# Patient Record
Sex: Female | Born: 1940
Health system: Southern US, Community
[De-identification: ages and names within clinical notes are randomized; demographics above are authoritative.]

## PROBLEM LIST (undated history)

## (undated) DIAGNOSIS — F419 Anxiety disorder, unspecified: Secondary | ICD-10-CM

## (undated) DIAGNOSIS — E7800 Pure hypercholesterolemia, unspecified: Secondary | ICD-10-CM

## (undated) DIAGNOSIS — E119 Type 2 diabetes mellitus without complications: Secondary | ICD-10-CM

## (undated) DIAGNOSIS — I10 Essential (primary) hypertension: Secondary | ICD-10-CM

## (undated) DIAGNOSIS — D696 Thrombocytopenia, unspecified: Secondary | ICD-10-CM

## (undated) HISTORY — DX: Type 2 diabetes mellitus without complications: E11.9

## (undated) HISTORY — DX: Anxiety disorder, unspecified: F41.9

## (undated) HISTORY — PX: COLONOSCOPY: SHX174

## (undated) HISTORY — DX: Pure hypercholesterolemia, unspecified: E78.00

## (undated) HISTORY — DX: Essential (primary) hypertension: I10

## (undated) HISTORY — DX: Thrombocytopenia, unspecified: D69.6

## (undated) HISTORY — PX: VAGINAL HYSTERECTOMY: SUR661

## (undated) HISTORY — PX: EYE EXAMINATION UNDER ANESTHESIA W/ RETINAL CRYOTHERAPY AND RETINAL LASER: SHX1561

---

## 1990-11-02 HISTORY — PX: BREAST EXCISIONAL BIOPSY: SUR124

## 1998-03-18 ENCOUNTER — Ambulatory Visit (HOSPITAL_COMMUNITY): Admission: RE | Admit: 1998-03-18 | Discharge: 1998-03-18 | Payer: Self-pay | Admitting: Cardiology

## 1999-03-06 ENCOUNTER — Other Ambulatory Visit: Admission: RE | Admit: 1999-03-06 | Discharge: 1999-03-06 | Payer: Self-pay | Admitting: *Deleted

## 1999-05-22 ENCOUNTER — Ambulatory Visit (HOSPITAL_COMMUNITY): Admission: RE | Admit: 1999-05-22 | Discharge: 1999-05-22 | Payer: Self-pay | Admitting: Cardiology

## 1999-06-03 ENCOUNTER — Ambulatory Visit (HOSPITAL_COMMUNITY): Admission: RE | Admit: 1999-06-03 | Discharge: 1999-06-03 | Payer: Self-pay | Admitting: Obstetrics and Gynecology

## 1999-06-03 ENCOUNTER — Encounter: Payer: Self-pay | Admitting: Cardiology

## 1999-12-23 ENCOUNTER — Encounter: Admission: RE | Admit: 1999-12-23 | Discharge: 1999-12-23 | Payer: Self-pay | Admitting: Cardiology

## 1999-12-23 ENCOUNTER — Encounter: Payer: Self-pay | Admitting: Cardiology

## 1999-12-24 ENCOUNTER — Ambulatory Visit (HOSPITAL_COMMUNITY): Admission: RE | Admit: 1999-12-24 | Discharge: 1999-12-24 | Payer: Self-pay | Admitting: Cardiology

## 2000-04-26 ENCOUNTER — Other Ambulatory Visit: Admission: RE | Admit: 2000-04-26 | Discharge: 2000-04-26 | Payer: Self-pay | Admitting: Obstetrics and Gynecology

## 2000-06-18 ENCOUNTER — Encounter: Payer: Self-pay | Admitting: Cardiology

## 2000-06-18 ENCOUNTER — Ambulatory Visit (HOSPITAL_COMMUNITY): Admission: RE | Admit: 2000-06-18 | Discharge: 2000-06-18 | Payer: Self-pay | Admitting: Cardiology

## 2000-06-28 ENCOUNTER — Ambulatory Visit (HOSPITAL_COMMUNITY): Admission: RE | Admit: 2000-06-28 | Discharge: 2000-06-28 | Payer: Self-pay | Admitting: Cardiology

## 2000-06-30 ENCOUNTER — Encounter: Admission: RE | Admit: 2000-06-30 | Discharge: 2000-09-28 | Payer: Self-pay | Admitting: Cardiology

## 2001-06-06 ENCOUNTER — Other Ambulatory Visit: Admission: RE | Admit: 2001-06-06 | Discharge: 2001-06-06 | Payer: Self-pay | Admitting: Obstetrics and Gynecology

## 2001-06-10 ENCOUNTER — Ambulatory Visit (HOSPITAL_COMMUNITY): Admission: RE | Admit: 2001-06-10 | Discharge: 2001-06-10 | Payer: Self-pay | Admitting: Obstetrics and Gynecology

## 2001-06-10 ENCOUNTER — Encounter: Payer: Self-pay | Admitting: Obstetrics and Gynecology

## 2002-07-13 ENCOUNTER — Encounter: Payer: Self-pay | Admitting: Obstetrics and Gynecology

## 2002-07-13 ENCOUNTER — Ambulatory Visit (HOSPITAL_COMMUNITY): Admission: RE | Admit: 2002-07-13 | Discharge: 2002-07-13 | Payer: Self-pay | Admitting: Obstetrics and Gynecology

## 2002-09-11 ENCOUNTER — Other Ambulatory Visit: Admission: RE | Admit: 2002-09-11 | Discharge: 2002-09-11 | Payer: Self-pay | Admitting: Obstetrics and Gynecology

## 2003-07-25 ENCOUNTER — Encounter: Payer: Self-pay | Admitting: Obstetrics and Gynecology

## 2003-07-25 ENCOUNTER — Ambulatory Visit (HOSPITAL_COMMUNITY): Admission: RE | Admit: 2003-07-25 | Discharge: 2003-07-25 | Payer: Self-pay | Admitting: Obstetrics and Gynecology

## 2003-09-19 ENCOUNTER — Other Ambulatory Visit: Admission: RE | Admit: 2003-09-19 | Discharge: 2003-09-19 | Payer: Self-pay | Admitting: Obstetrics and Gynecology

## 2004-03-26 ENCOUNTER — Encounter: Admission: RE | Admit: 2004-03-26 | Discharge: 2004-03-26 | Payer: Self-pay | Admitting: Cardiology

## 2004-03-26 IMAGING — CR DG HIP (WITH OR WITHOUT PELVIS) 2-3V*L*
3 series · 3 of 3 positions shown · non-contrast
Comparison: none

CLINICAL DATA: Left lateral hip pain.
 LEFT HIP COMPLETE ? TWO VIEWS
 There is diffuse fairly symmetrical joint space narrowing with subchondral cysts in the superior aspect of the left acetabulum.  Femoral head appears normal.  No soft tissue calcifications.  The patient has similar, but less extensive findings in the right acetabulum. 
 IMPRESSION
 Degenerative arthritic changes of the left acetabulum with subchondral cysts on the superior aspect of the acetabulum.  Mild joint space narrowing particularly superiorly.

[view not recorded (1 of 3)]
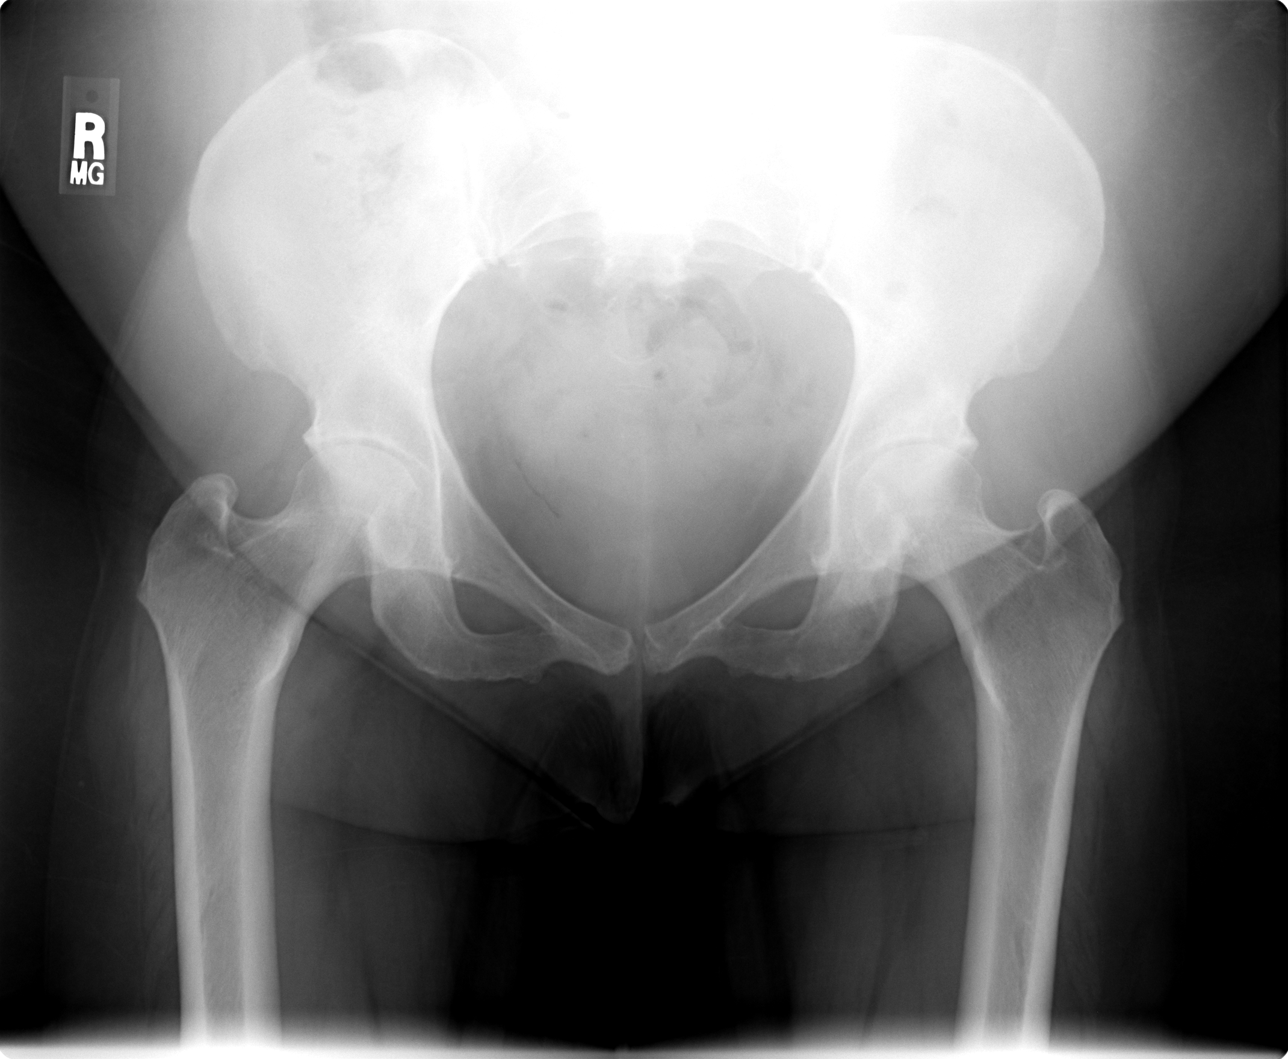

[view not recorded (2 of 3)]
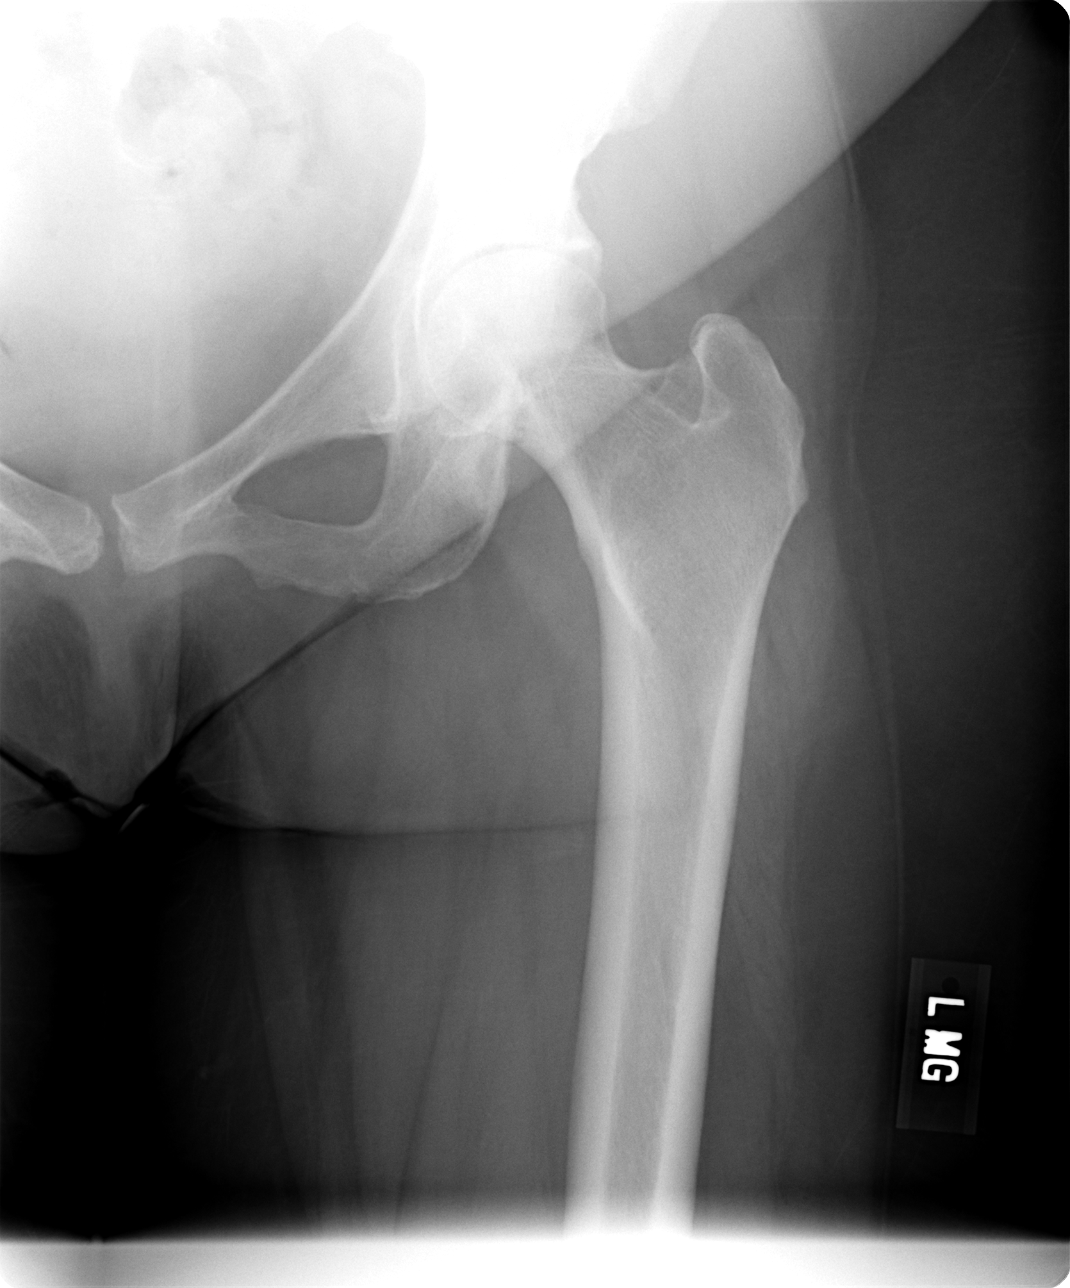

[view not recorded (3 of 3)]
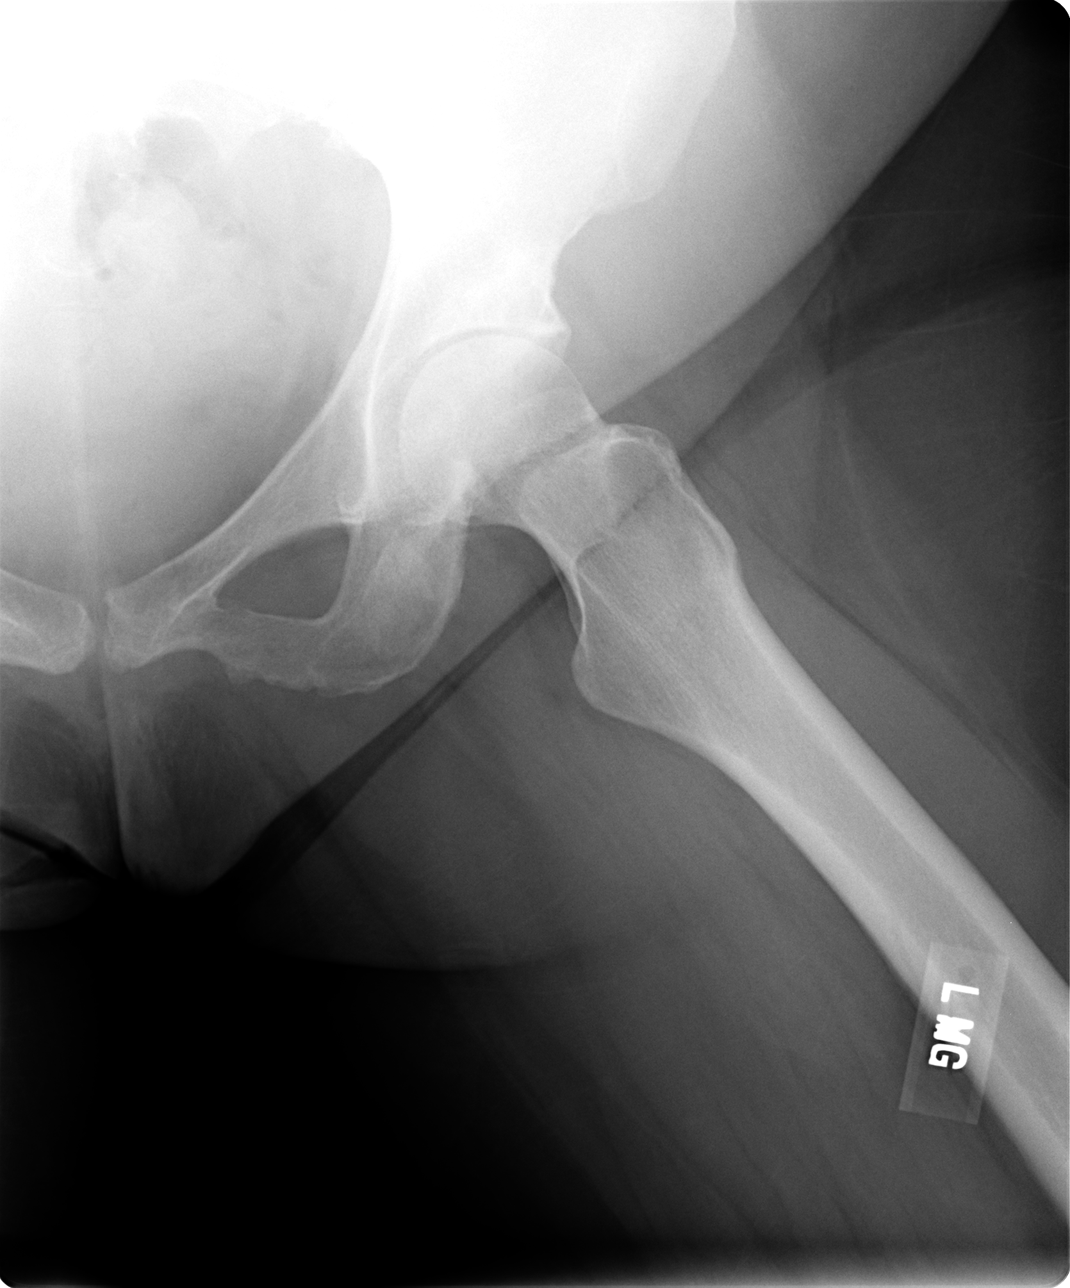

[3 of 3 positions shown; findings below may reference images not displayed]

## 2004-09-10 ENCOUNTER — Ambulatory Visit (HOSPITAL_COMMUNITY): Admission: RE | Admit: 2004-09-10 | Discharge: 2004-09-10 | Payer: Self-pay | Admitting: Cardiology

## 2005-09-28 ENCOUNTER — Ambulatory Visit (HOSPITAL_COMMUNITY): Admission: RE | Admit: 2005-09-28 | Discharge: 2005-09-28 | Payer: Self-pay | Admitting: Cardiology

## 2006-03-24 ENCOUNTER — Ambulatory Visit: Payer: Self-pay | Admitting: Hematology & Oncology

## 2006-03-31 LAB — CBC WITH DIFFERENTIAL/PLATELET
BASO%: 0.6 % (ref 0.0–2.0)
EOS%: 2.4 % (ref 0.0–7.0)
Eosinophils Absolute: 0.1 10*3/uL (ref 0.0–0.5)
LYMPH%: 19.9 % (ref 14.0–48.0)
MONO%: 7.4 % (ref 0.0–13.0)
NEUT%: 69.7 % (ref 39.6–76.8)
WBC: 5.6 10*3/uL (ref 3.9–10.0)

## 2006-03-31 LAB — CHCC SMEAR

## 2006-04-01 LAB — ANA: Anti Nuclear Antibody(ANA): NEGATIVE

## 2006-04-01 LAB — VITAMIN B12: Vitamin B-12: 744 pg/mL (ref 211–911)

## 2006-04-01 LAB — PROTEIN ELECTROPHORESIS, SERUM: Gamma Globulin: 11.9 % (ref 11.1–18.8)

## 2006-06-11 ENCOUNTER — Encounter: Admission: RE | Admit: 2006-06-11 | Discharge: 2006-06-11 | Payer: Self-pay | Admitting: General Surgery

## 2006-06-11 IMAGING — CT CT ABDOMEN W/ CM
3 of 7 series · 12 of 46 positions shown, 19 images · IV contrast (redicat/h2o & [ID] omnipaque)
Comparison: none

CLINICAL DATA: Left groin pain, evaluate for inguinal hernia.  
 CT ABDOMEN AND PELVIS WITH CONTRAST:
TECHNIQUE: Multidetector CT imaging of the abdomen and pelvis was performed following the standard protocol during bolus administration of intravenous contrast.
 Contrast:  533cc Omnipaque 300.
 CT ABDOMEN WITH CONTRAST:

[Series 2: routine abd/pelvis · axial · 0.70mm/px · z∈[-360,-50]mm · 8 of 80 slices shown, 13 images]
[im 9/80  soft-tissue]
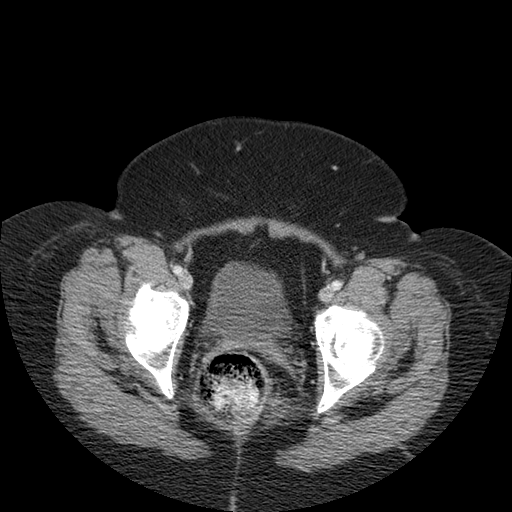
[im 9/80  bone]
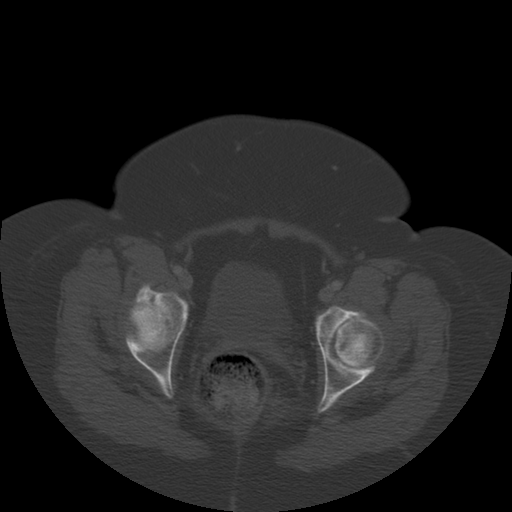
[im 18/80  soft-tissue]
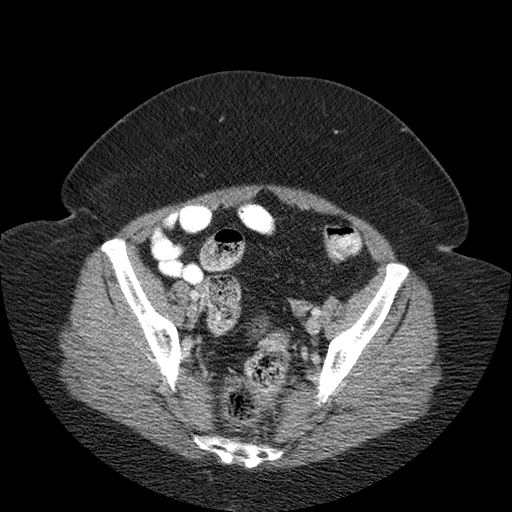
[im 27/80  soft-tissue]
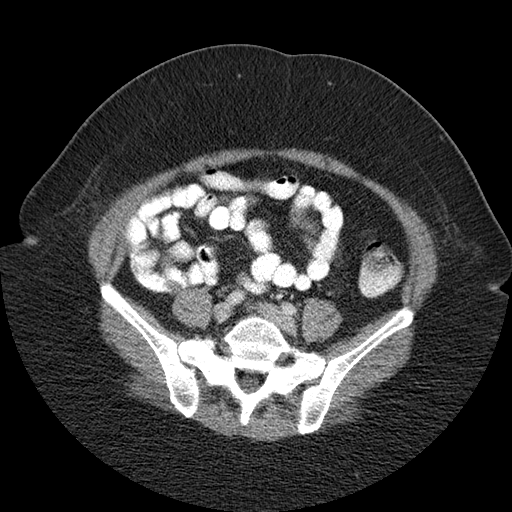
[im 36/80  soft-tissue]
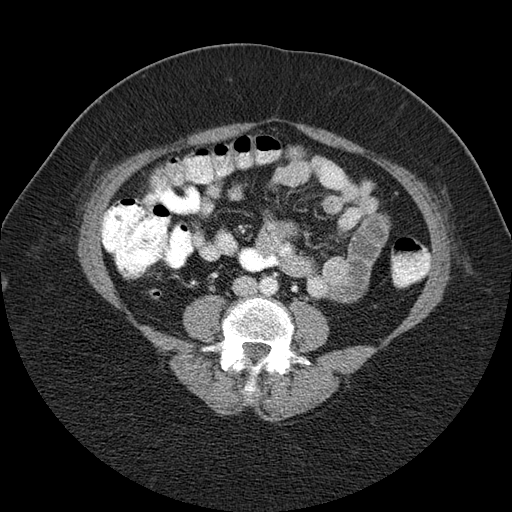
[im 44/80  soft-tissue]
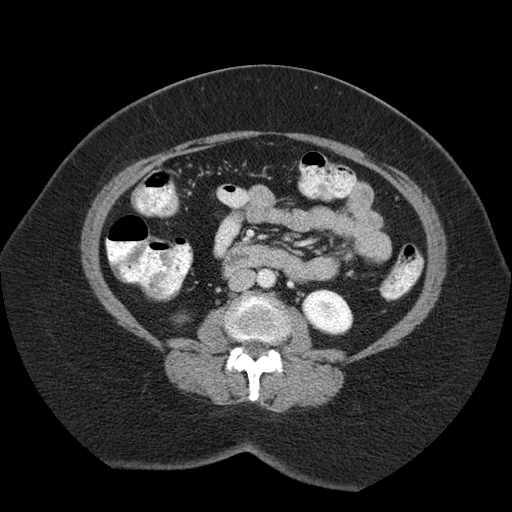
[im 44/80  lung]
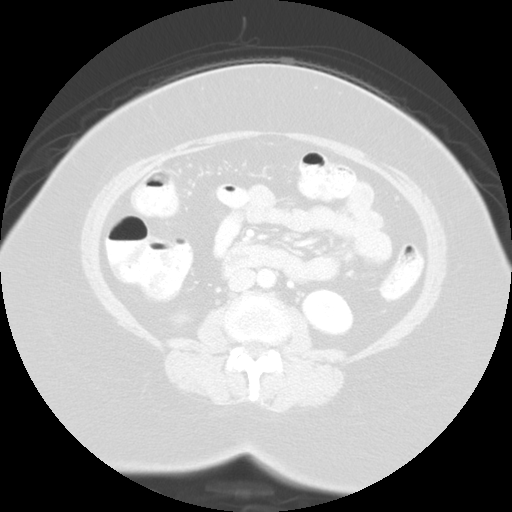
[im 53/80  soft-tissue]
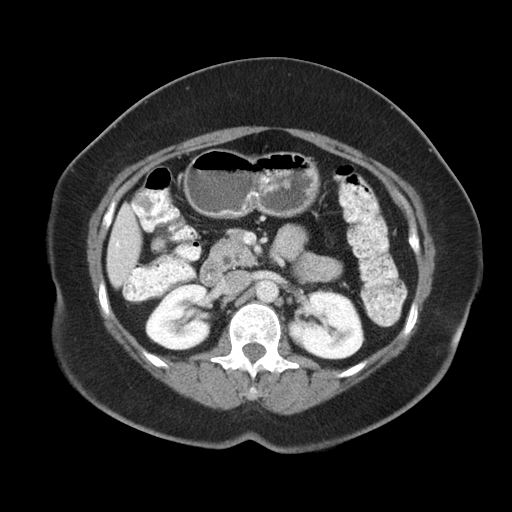
[im 53/80  lung]
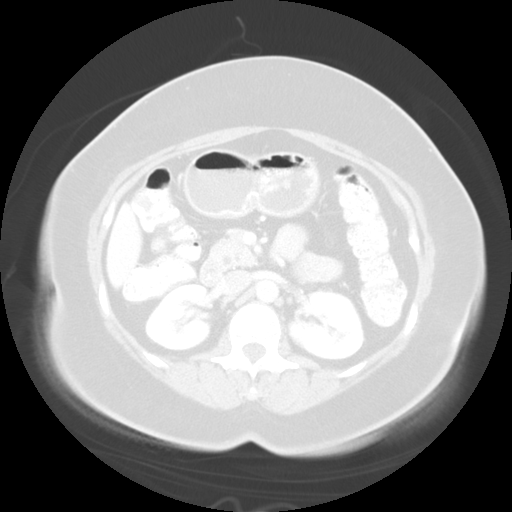
[im 62/80  soft-tissue]
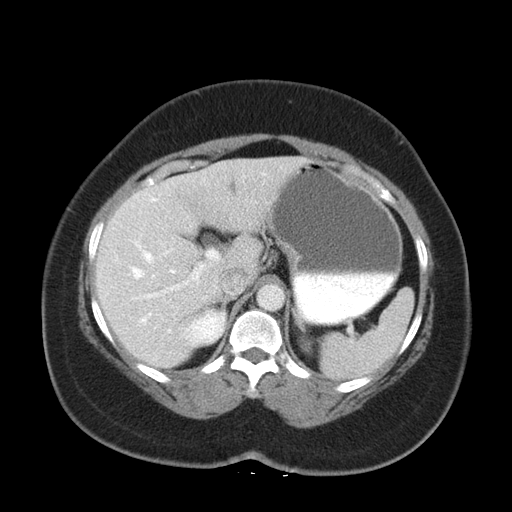
[im 62/80  lung]
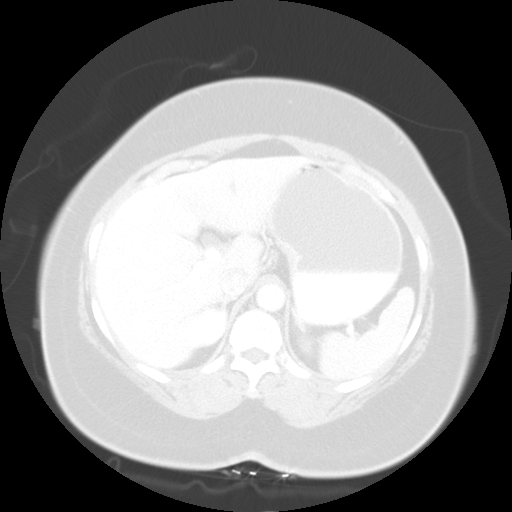
[im 71/80  soft-tissue]
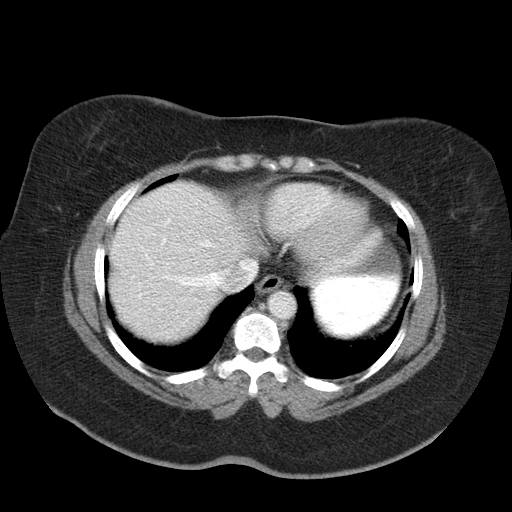
[im 71/80  lung]
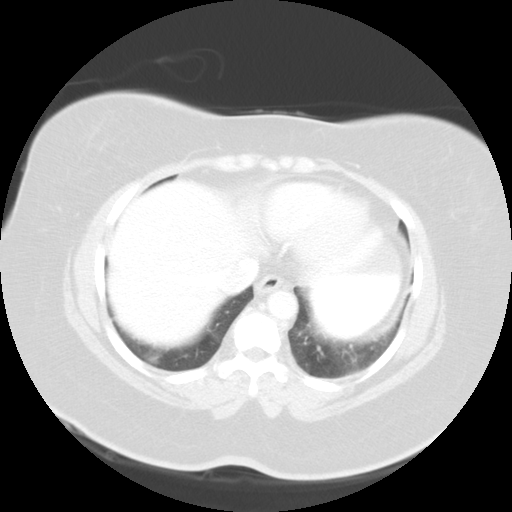

[Series 985: reformatted · sagittal · 0.81mm/px · 3 of 118 slices shown, 4 images (1 of 2)]
[im 40/118  soft-tissue]
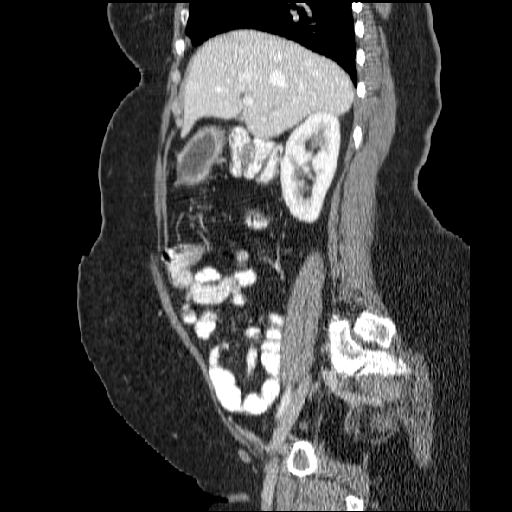
[im 53/118  soft-tissue]
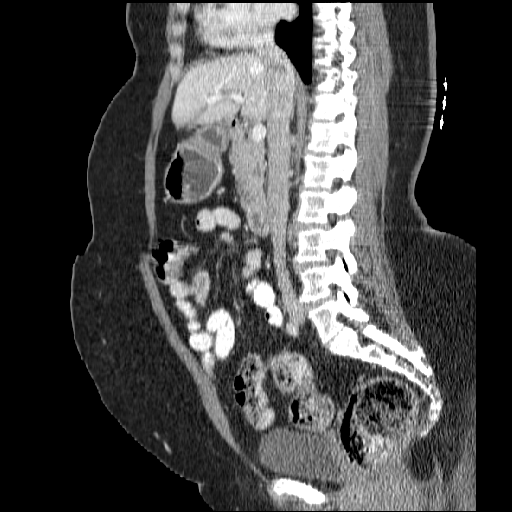
[im 53/118  bone]
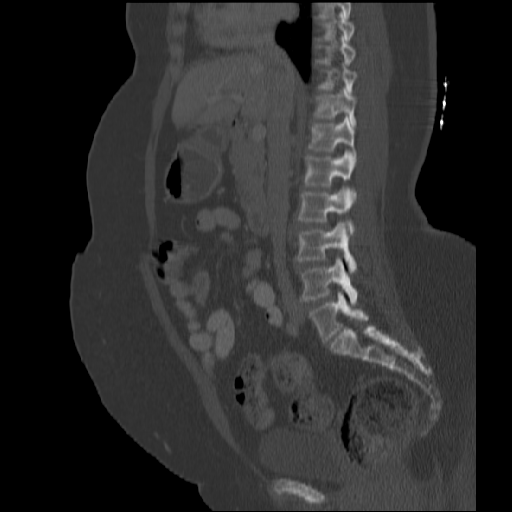
[im 66/118  soft-tissue]
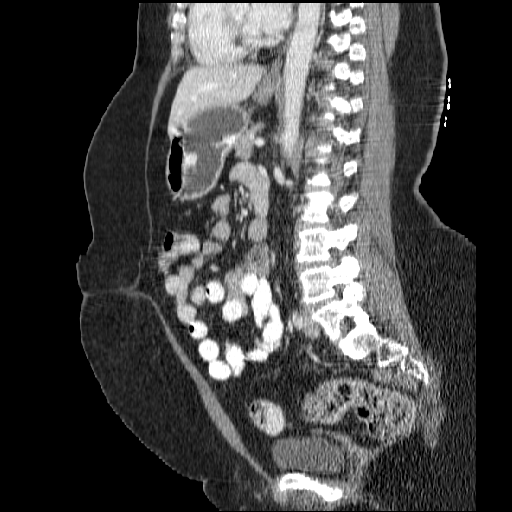

[Series 986: reformatted · coronal · 0.81mm/px · 1 of 106 slices shown, 2 images (2 of 2)]
[im 36/106  soft-tissue]
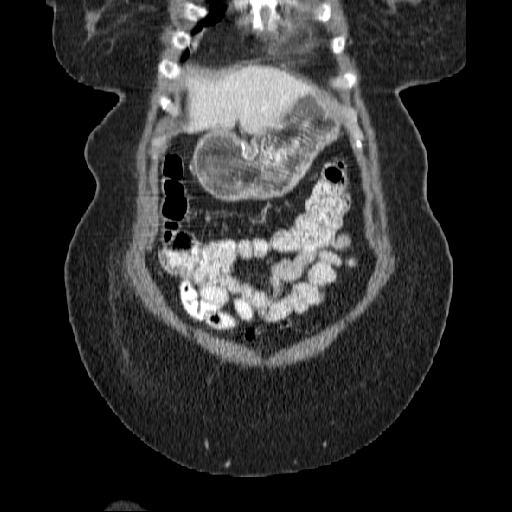
[im 36/106  bone]
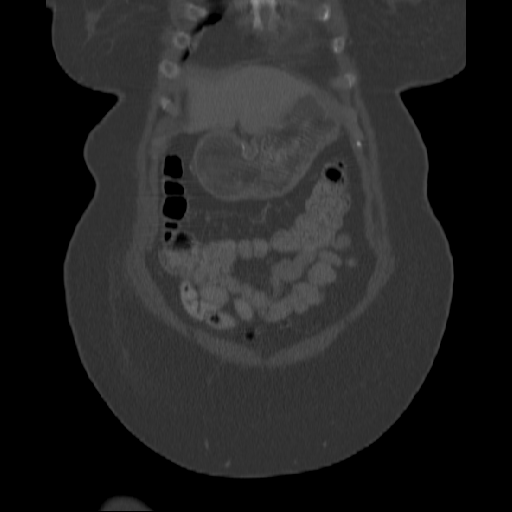

[12 of 46 positions shown; findings below may reference images not displayed]

FINDINGS: The lungs bases are clear.  The liver enhances well.  There is slight prominence of the central intrahepatic ducts and common bile duct to the ampulla.  At the level of the ampulla the common bile duct measures 8.8mm which is enlarged.  Correlation with LFTs is recommended.  No pancreatic head mass is seen and the pancreatic duct is not dilated.  No calcified gallstones are noted in the contracted gallbladder.  The adrenal glands and spleen appear normal.  The kidneys enhance normally.  And on delayed images the pelvocaliceal systems appear normal.  The proximal ureters are normal in caliber.  The abdominal aorta is normal.
IMPRESSION: Slightly prominent common bile duct and intrahepatic ducts to the ampulla.  No pancreatic head mass is seen.  Correlate with LFTs.  
 CT PELVIS WITH CONTRAST:
FINDINGS: Scans were continued through the pelvis after oral and IV contrast media were given.  The appendix is well seen and appears normal as does the terminal ileum.  There is feces throughout the rectosigmoid colon.  The urinary bladder is unremarkable.  The patient has previously undergone hysterectomy.  There is no evidence of inguinal hernia.  No adenopathy is seen.  There are degenerative changes noted in the hips with subchondral cyst formation.
IMPRESSION: 1.  No inguinal hernia is seen.  
 2.  Appendix and terminal ileum appear normal.  
 3.  No adnexal lesion is seen.

## 2006-06-28 ENCOUNTER — Ambulatory Visit: Payer: Self-pay | Admitting: Hematology & Oncology

## 2006-06-30 LAB — CBC WITH DIFFERENTIAL/PLATELET
BASO%: 0.6 % (ref 0.0–2.0)
Basophils Absolute: 0 10*3/uL (ref 0.0–0.1)
EOS%: 1.8 % (ref 0.0–7.0)
Eosinophils Absolute: 0.1 10*3/uL (ref 0.0–0.5)
HGB: 13.5 g/dL (ref 11.6–15.9)
LYMPH%: 16.3 % (ref 14.0–48.0)
MCH: 28.1 pg (ref 26.0–34.0)
MONO#: 0.3 10*3/uL (ref 0.1–0.9)
RBC: 4.8 10*6/uL (ref 3.70–5.32)
RDW: 14.5 % (ref 11.3–14.5)
WBC: 5.2 10*3/uL (ref 3.9–10.0)

## 2006-06-30 LAB — CHCC SMEAR

## 2006-10-06 ENCOUNTER — Ambulatory Visit (HOSPITAL_COMMUNITY): Admission: RE | Admit: 2006-10-06 | Discharge: 2006-10-06 | Payer: Self-pay | Admitting: Cardiology

## 2006-10-06 ENCOUNTER — Encounter: Admission: RE | Admit: 2006-10-06 | Discharge: 2006-10-06 | Payer: Self-pay | Admitting: Cardiology

## 2006-10-06 IMAGING — CR DG CHEST 2V
2 series · 2 of 2 positions shown · non-contrast
Comparison: None.

CLINICAL DATA: 65 year old with diabetes and hypertension. 
 CHEST ? 2 VIEW:

[view not recorded (1 of 2)]
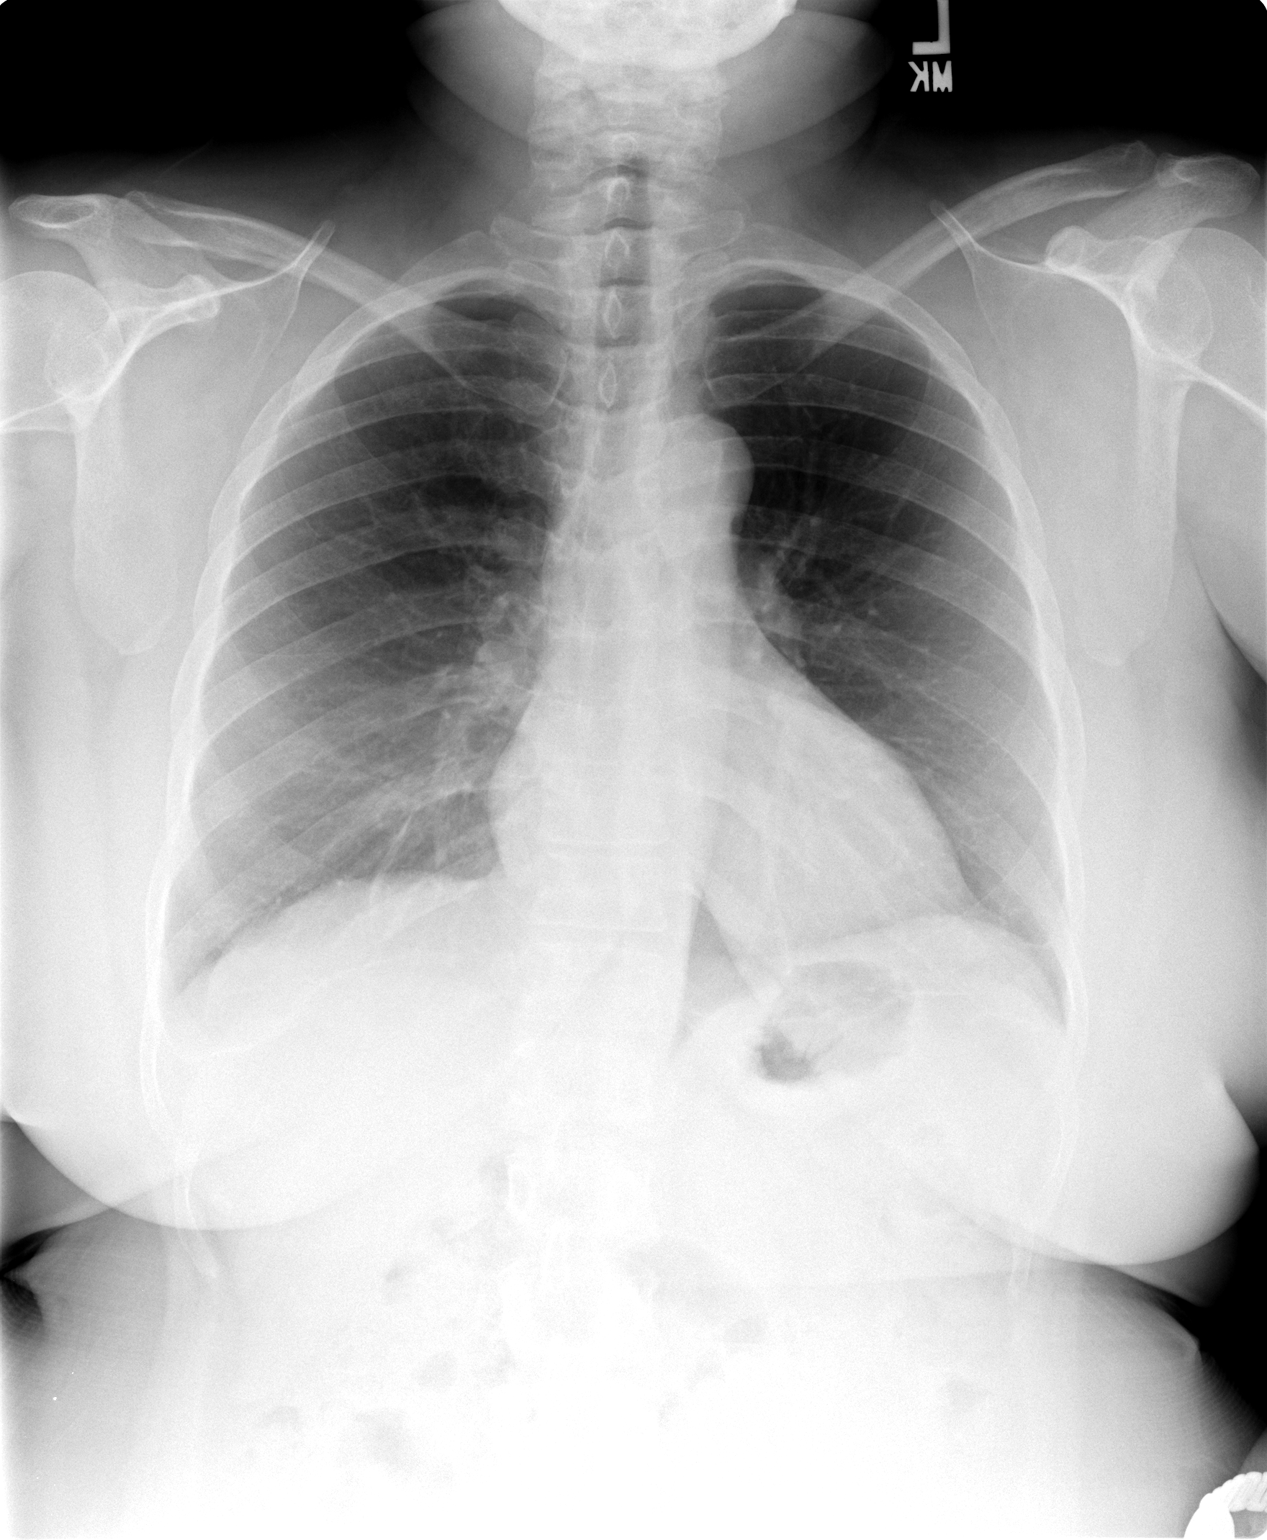

[view not recorded (2 of 2)]
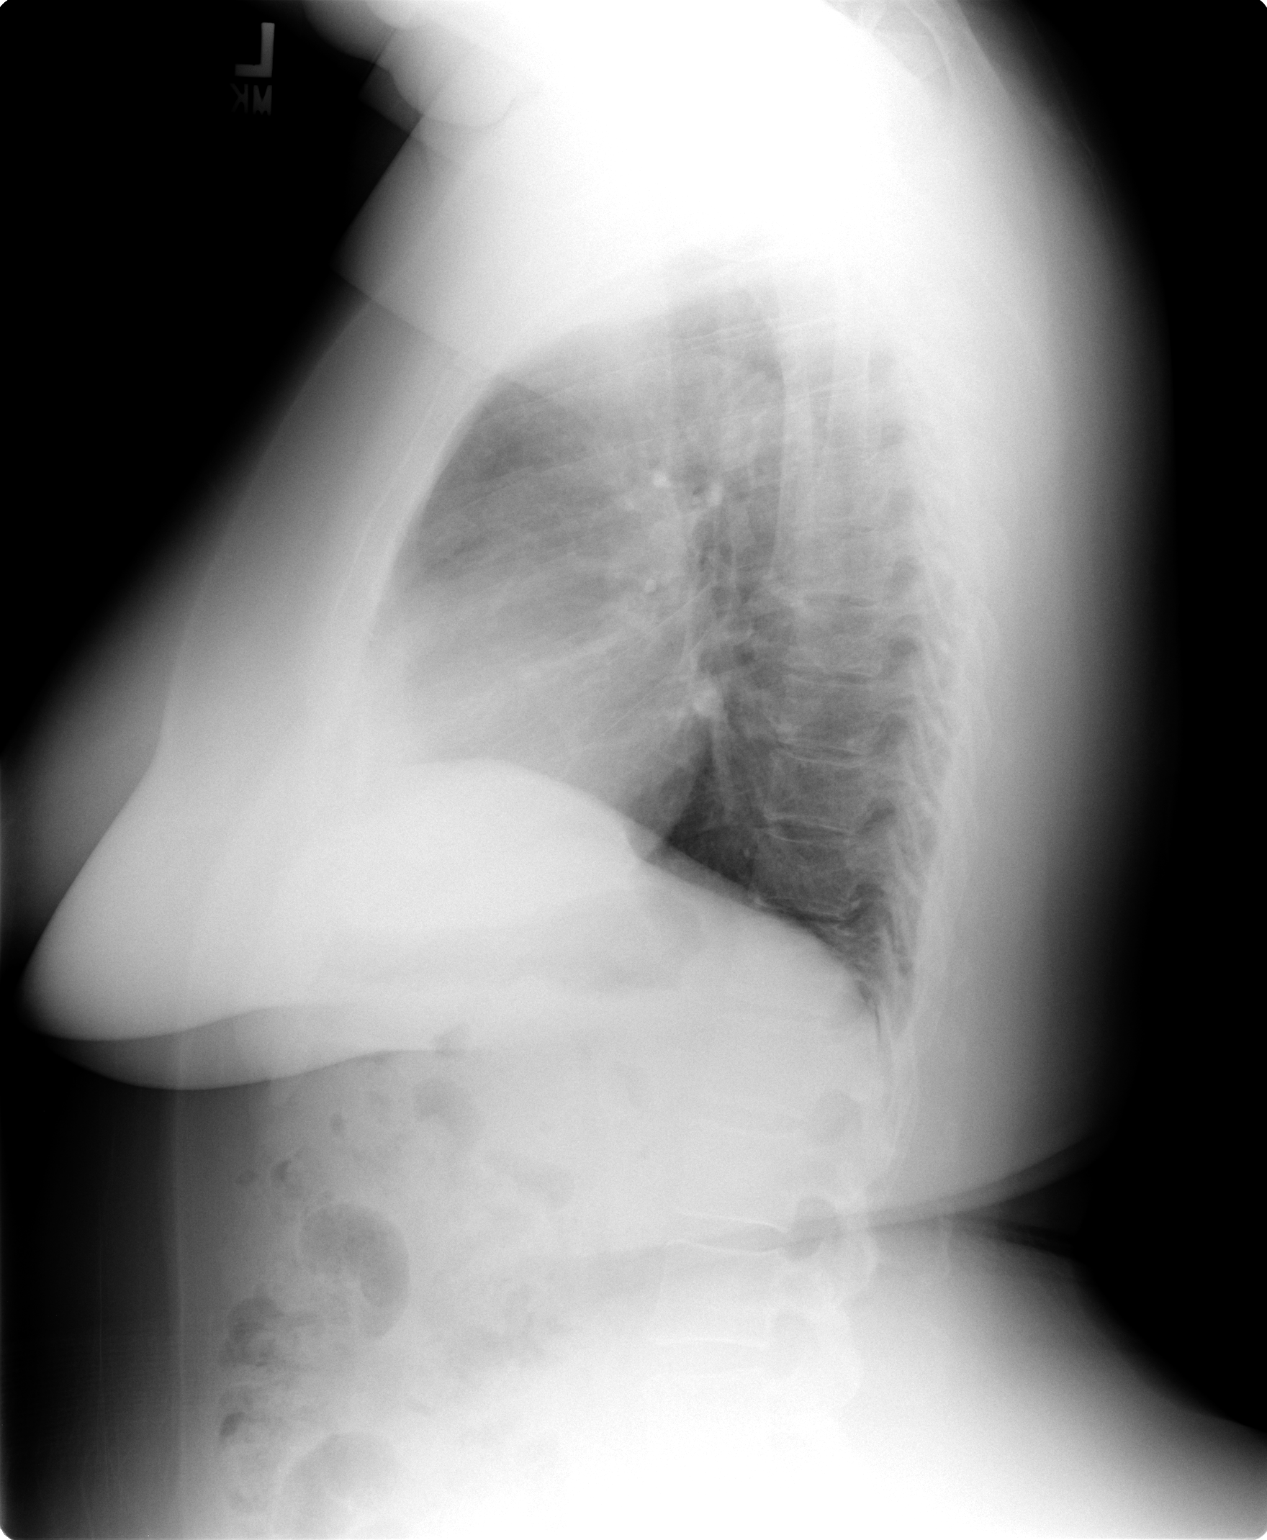

[2 of 2 positions shown; findings below may reference images not displayed]

FINDINGS: Cardiac silhouette and mediastinal contours are within normal limits.  Streaky basilar scarring or atelectasis.  No effusion, edema or infiltrate.  Bony structures are intact.
IMPRESSION: Streaky basilar scarring changes or subsegmental atelectasis.  No infiltrates, edema or effusions.

## 2006-10-06 IMAGING — MG MM DIGITAL SCREENING BILAT
4 series · 4 of 4 positions shown · non-contrast
Comparison: none

DG SCREEN MAMMOGRAM BILATERAL
Bilateral CC and MLO view(s) were taken.

DIGITAL SCREENING MAMMOGRAM WITH CAD:
There is a fibroglandular pattern.  No masses or malignant type calcifications are identified.  
Compared with prior studies.

[R CC]
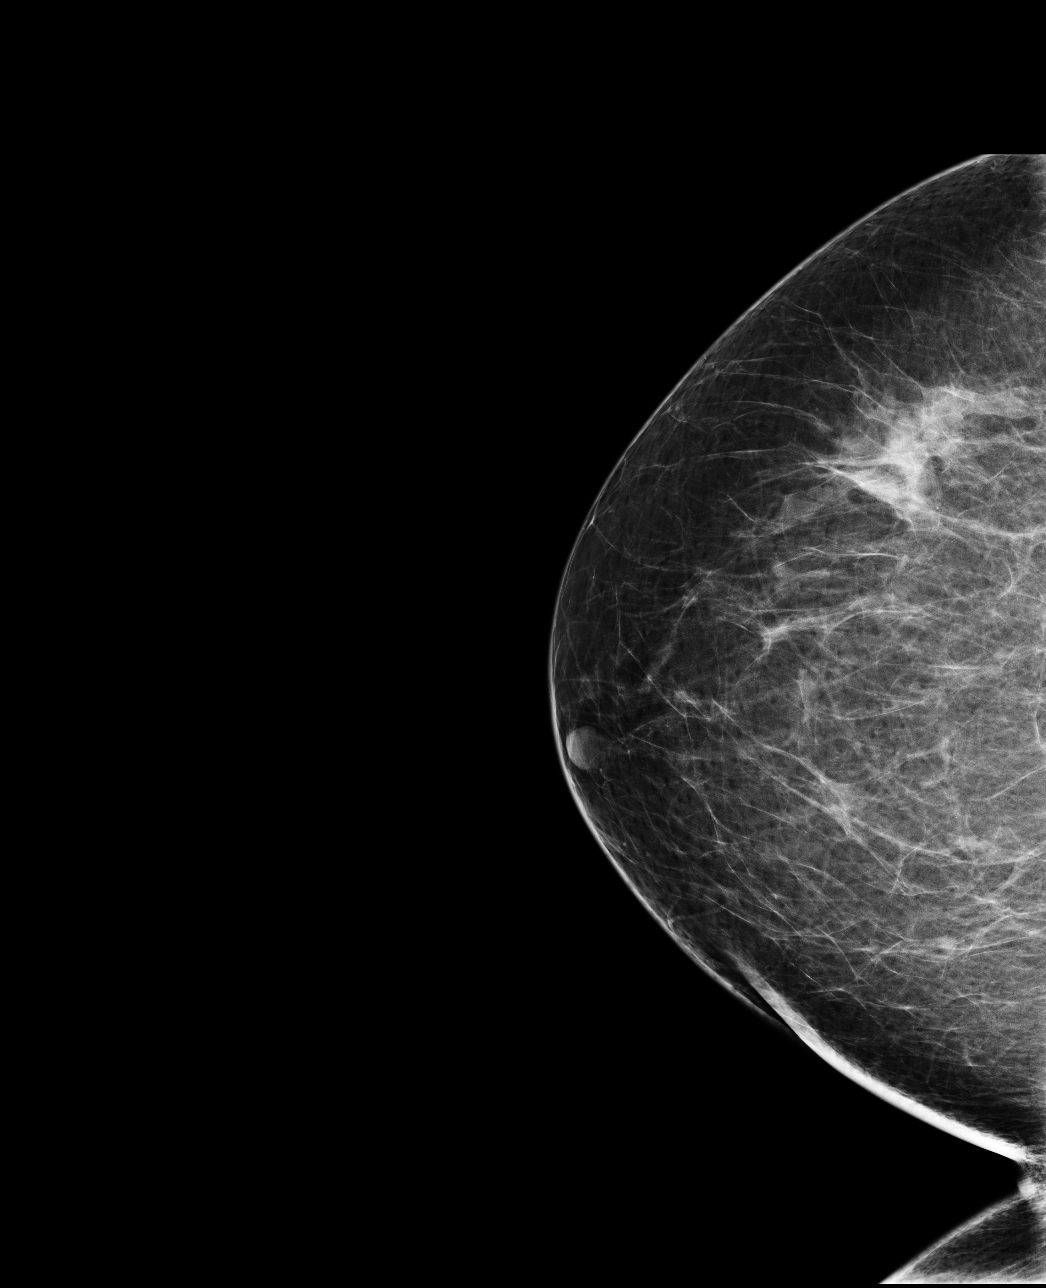

[R MLO]
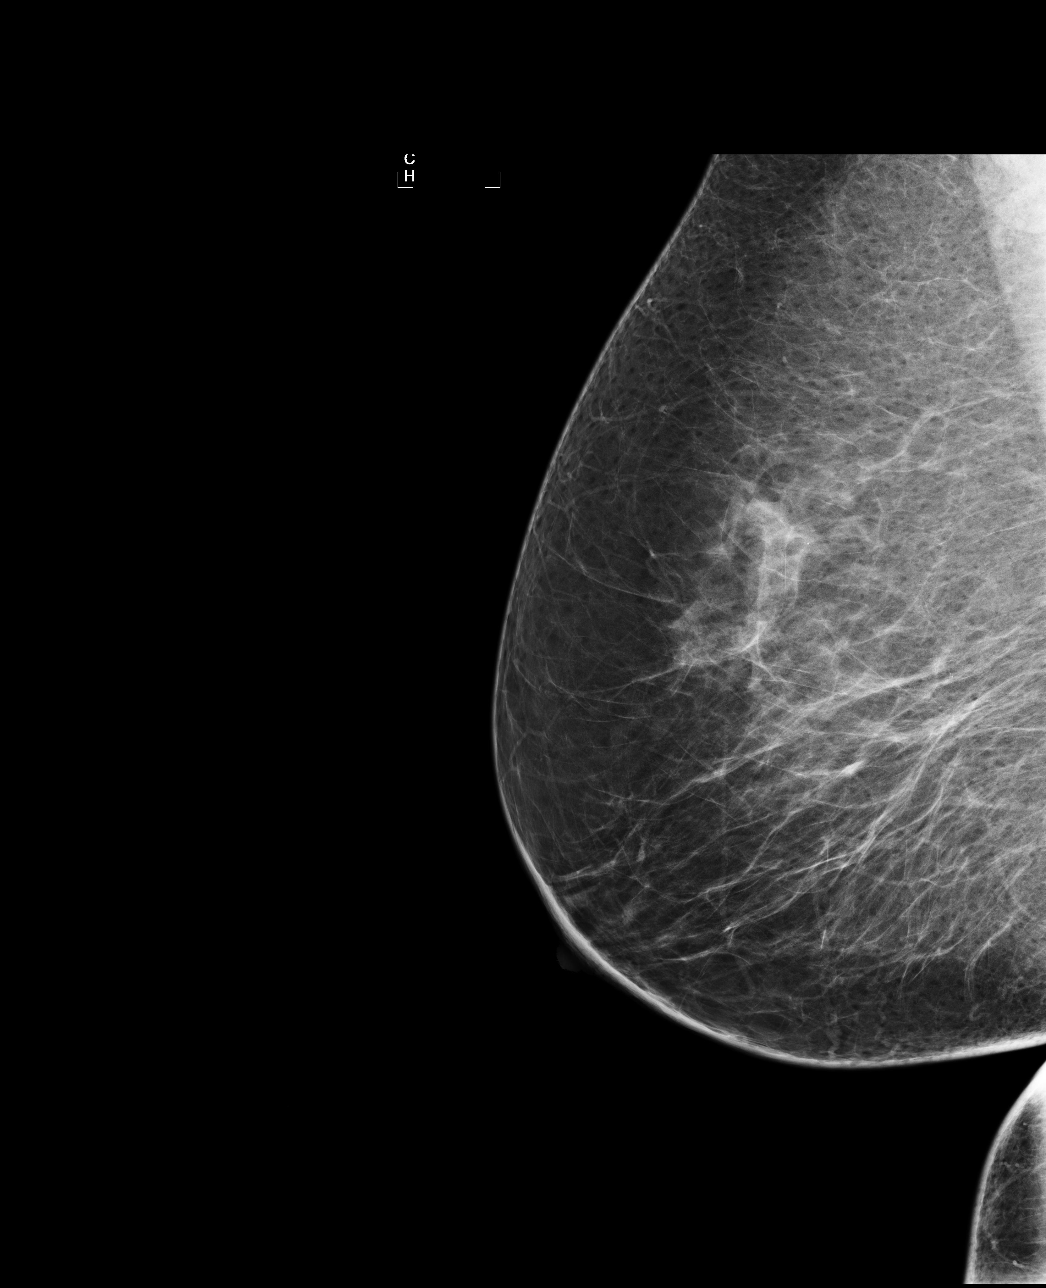

[L CC]
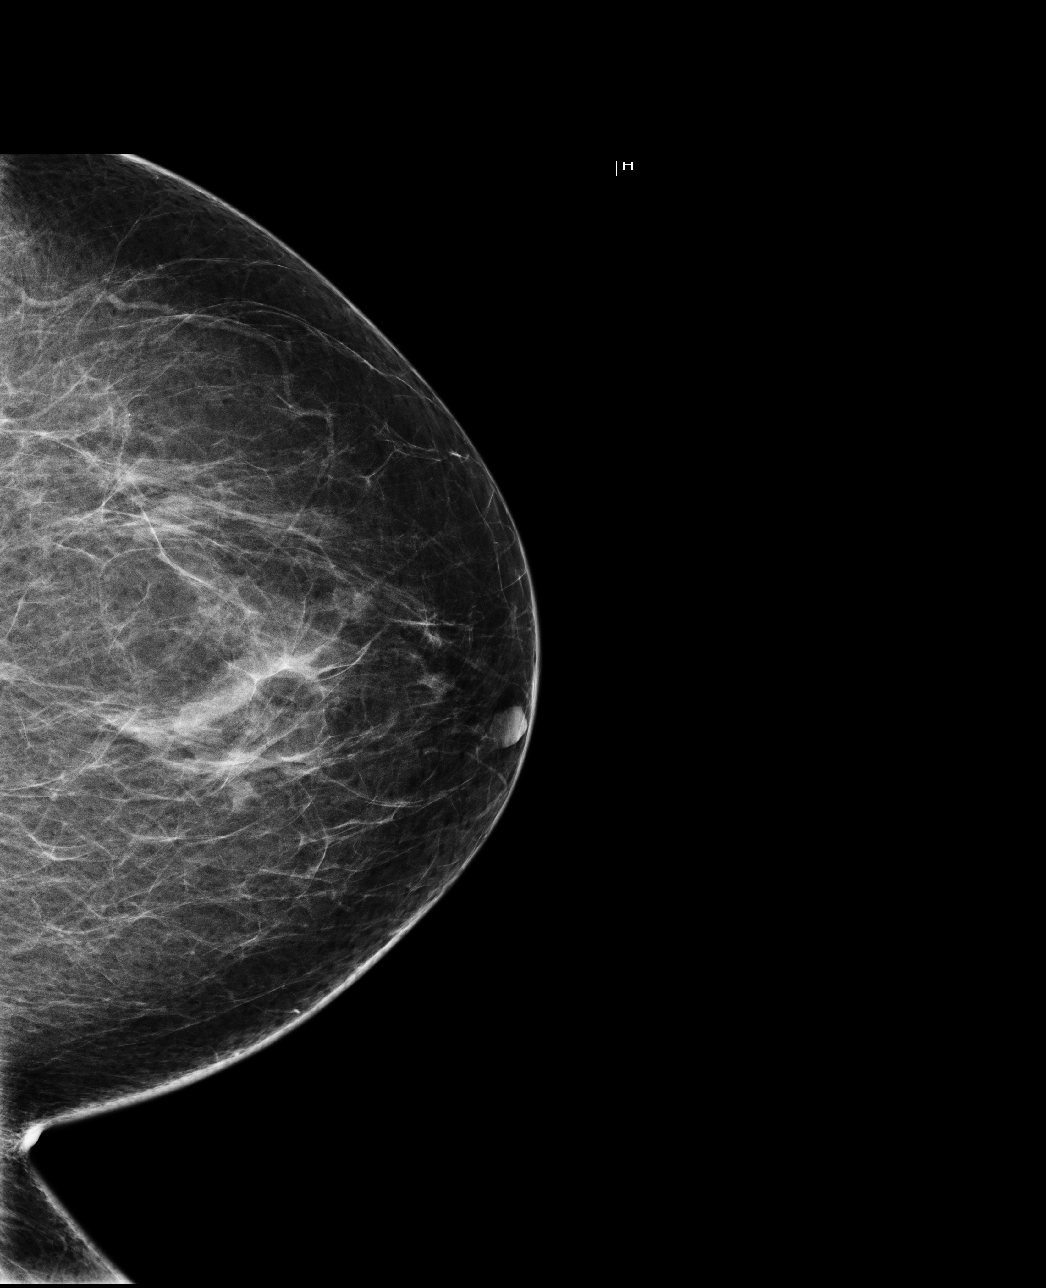

[L MLO]
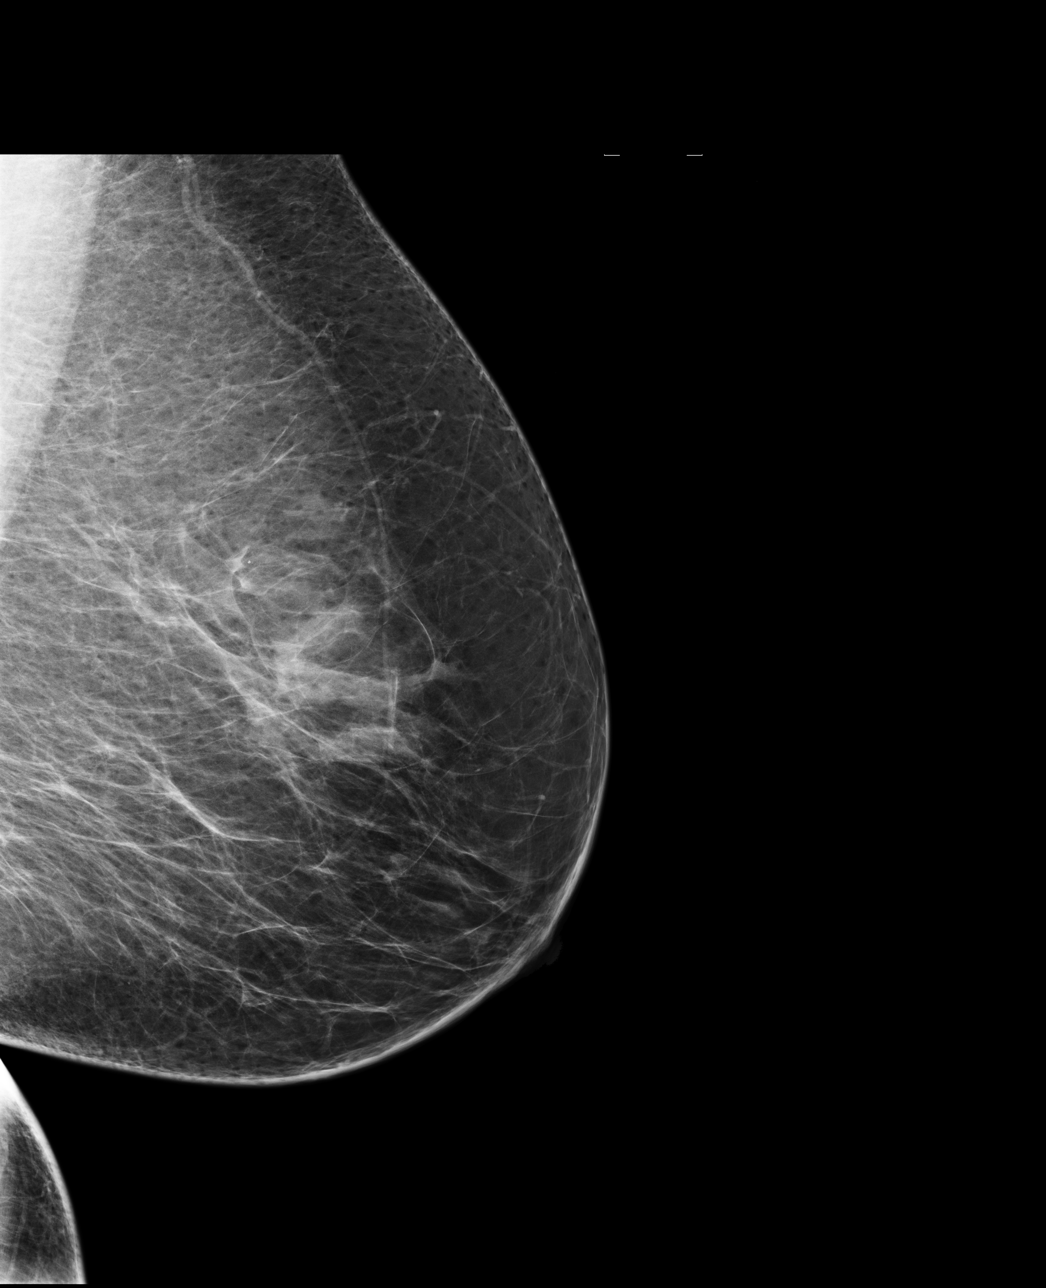

[4 of 4 positions shown; findings below may reference images not displayed]

IMPRESSION: No specific mammographic evidence of malignancy.  Next screening mammogram is recommended in one 
year.

ASSESSMENT: Negative - BI-RADS 1

Screening mammogram in 1 year.
ANALYZED BY COMPUTER AIDED DETECTION. , THIS PROCEDURE WAS A DIGITAL MAMMOGRAM.

## 2006-12-27 ENCOUNTER — Ambulatory Visit: Payer: Self-pay | Admitting: Hematology & Oncology

## 2006-12-29 LAB — CBC WITH DIFFERENTIAL/PLATELET
Basophils Absolute: 0 10*3/uL (ref 0.0–0.1)
EOS%: 1.6 % (ref 0.0–7.0)
HCT: 38.5 % (ref 34.8–46.6)
HGB: 13.2 g/dL (ref 11.6–15.9)
MCH: 28.4 pg (ref 26.0–34.0)
MCV: 82.4 fL (ref 81.0–101.0)
MONO%: 6.4 % (ref 0.0–13.0)
NEUT%: 75.8 % (ref 39.6–76.8)
Platelets: 92 10*3/uL — ABNORMAL LOW (ref 145–400)

## 2007-06-28 ENCOUNTER — Ambulatory Visit: Payer: Self-pay | Admitting: Hematology & Oncology

## 2007-06-30 LAB — CBC WITH DIFFERENTIAL/PLATELET
Eosinophils Absolute: 0.1 10*3/uL (ref 0.0–0.5)
LYMPH%: 15.8 % (ref 14.0–48.0)
MCHC: 34.4 g/dL (ref 32.0–36.0)
MCV: 80.9 fL — ABNORMAL LOW (ref 81.0–101.0)
MONO%: 8.6 % (ref 0.0–13.0)
NEUT#: 5.2 10*3/uL (ref 1.5–6.5)
Platelets: 99 10*3/uL — ABNORMAL LOW (ref 145–400)
RBC: 4.72 10*6/uL (ref 3.70–5.32)

## 2007-06-30 LAB — CHCC SMEAR

## 2007-11-01 ENCOUNTER — Ambulatory Visit (HOSPITAL_COMMUNITY): Admission: RE | Admit: 2007-11-01 | Discharge: 2007-11-01 | Payer: Self-pay | Admitting: Cardiology

## 2007-11-03 HISTORY — PX: EYE SURGERY: SHX253

## 2008-07-10 ENCOUNTER — Ambulatory Visit: Payer: Self-pay | Admitting: Hematology & Oncology

## 2008-07-11 LAB — CBC WITH DIFFERENTIAL (CANCER CENTER ONLY)
BASO#: 0 10*3/uL (ref 0.0–0.2)
Eosinophils Absolute: 0.2 10*3/uL (ref 0.0–0.5)
HGB: 13 g/dL (ref 11.6–15.9)
LYMPH%: 18.9 % (ref 14.0–48.0)
MCH: 26.7 pg (ref 26.0–34.0)
MCV: 79 fL — ABNORMAL LOW (ref 81–101)
MONO#: 0.3 10*3/uL (ref 0.1–0.9)
MONO%: 4.5 % (ref 0.0–13.0)
NEUT#: 4.9 10*3/uL (ref 1.5–6.5)
Platelets: 77 10*3/uL — ABNORMAL LOW (ref 145–400)
RBC: 4.88 10*6/uL (ref 3.70–5.32)
WBC: 6.6 10*3/uL (ref 3.9–10.0)

## 2008-10-31 ENCOUNTER — Ambulatory Visit (HOSPITAL_COMMUNITY): Admission: RE | Admit: 2008-10-31 | Discharge: 2008-10-31 | Payer: Self-pay | Admitting: Cardiology

## 2009-01-23 ENCOUNTER — Ambulatory Visit: Payer: Self-pay | Admitting: Hematology & Oncology

## 2009-01-24 LAB — CBC WITH DIFFERENTIAL (CANCER CENTER ONLY)
BASO#: 0 10*3/uL (ref 0.0–0.2)
Eosinophils Absolute: 0.1 10*3/uL (ref 0.0–0.5)
HGB: 12.7 g/dL (ref 11.6–15.9)
LYMPH#: 1.4 10*3/uL (ref 0.9–3.3)
MONO%: 6.4 % (ref 0.0–13.0)
NEUT#: 4.1 10*3/uL (ref 1.5–6.5)
Platelets: 70 10*3/uL — ABNORMAL LOW (ref 145–400)
RBC: 4.74 10*6/uL (ref 3.70–5.32)
WBC: 6 10*3/uL (ref 3.9–10.0)

## 2009-07-10 ENCOUNTER — Ambulatory Visit: Payer: Self-pay | Admitting: Hematology & Oncology

## 2009-07-11 LAB — CBC WITH DIFFERENTIAL (CANCER CENTER ONLY)
BASO%: 0.4 % (ref 0.0–2.0)
EOS%: 2.3 % (ref 0.0–7.0)
HCT: 37 % (ref 34.8–46.6)
LYMPH#: 1.1 10*3/uL (ref 0.9–3.3)
LYMPH%: 17.6 % (ref 14.0–48.0)
MCH: 27 pg (ref 26.0–34.0)
MCHC: 33.8 g/dL (ref 32.0–36.0)
MONO%: 5 % (ref 0.0–13.0)
NEUT%: 74.7 % (ref 39.6–80.0)
RDW: 14.2 % (ref 10.5–14.6)

## 2009-07-11 LAB — CHCC SATELLITE - SMEAR

## 2009-11-04 ENCOUNTER — Ambulatory Visit (HOSPITAL_COMMUNITY): Admission: RE | Admit: 2009-11-04 | Discharge: 2009-11-04 | Payer: Self-pay | Admitting: Cardiology

## 2010-01-10 ENCOUNTER — Ambulatory Visit: Payer: Self-pay | Admitting: Hematology & Oncology

## 2010-01-13 LAB — CBC WITH DIFFERENTIAL (CANCER CENTER ONLY)
BASO%: 0.4 % (ref 0.0–2.0)
EOS%: 1.2 % (ref 0.0–7.0)
HGB: 13.5 g/dL (ref 11.6–15.9)
LYMPH#: 1.1 10*3/uL (ref 0.9–3.3)
LYMPH%: 15.9 % (ref 14.0–48.0)
MCH: 26 pg (ref 26.0–34.0)
MONO#: 0.3 10*3/uL (ref 0.1–0.9)
MONO%: 4 % (ref 0.0–13.0)
NEUT%: 78.5 % (ref 39.6–80.0)
Platelets: 113 10*3/uL — ABNORMAL LOW (ref 145–400)

## 2010-01-13 LAB — CHCC SATELLITE - SMEAR

## 2010-07-11 ENCOUNTER — Ambulatory Visit: Payer: Self-pay | Admitting: Hematology & Oncology

## 2010-07-14 LAB — CBC WITH DIFFERENTIAL (CANCER CENTER ONLY)
BASO#: 0 10*3/uL (ref 0.0–0.2)
BASO%: 0.4 % (ref 0.0–2.0)
EOS%: 2.1 % (ref 0.0–7.0)
Eosinophils Absolute: 0.1 10*3/uL (ref 0.0–0.5)
HCT: 40.1 % (ref 34.8–46.6)
HGB: 13.2 g/dL (ref 11.6–15.9)
LYMPH#: 1.1 10*3/uL (ref 0.9–3.3)
LYMPH%: 20 % (ref 14.0–48.0)
MCH: 26.5 pg (ref 26.0–34.0)
MCHC: 32.8 g/dL (ref 32.0–36.0)
MCV: 81 fL (ref 81–101)
MONO#: 0.3 10*3/uL (ref 0.1–0.9)
MONO%: 5.5 % (ref 0.0–13.0)
NEUT#: 4 10*3/uL (ref 1.5–6.5)
NEUT%: 72 % (ref 39.6–80.0)
Platelets: 82 10*3/uL — ABNORMAL LOW (ref 145–400)
RBC: 4.97 10*6/uL (ref 3.70–5.32)
RDW: 12.9 % (ref 10.5–14.6)
WBC: 5.5 10*3/uL (ref 3.9–10.0)

## 2010-07-14 LAB — CHCC SATELLITE - SMEAR

## 2010-09-16 ENCOUNTER — Encounter: Admission: RE | Admit: 2010-09-16 | Discharge: 2010-09-16 | Payer: Self-pay | Admitting: Cardiology

## 2010-09-16 IMAGING — CR DG CHEST 2V
2 series · 2 of 2 positions shown · non-contrast
Comparison: 10/06/2006

CLINICAL DATA: Right anterior chest pain

CHEST - 2 VIEW

[w chest pa]
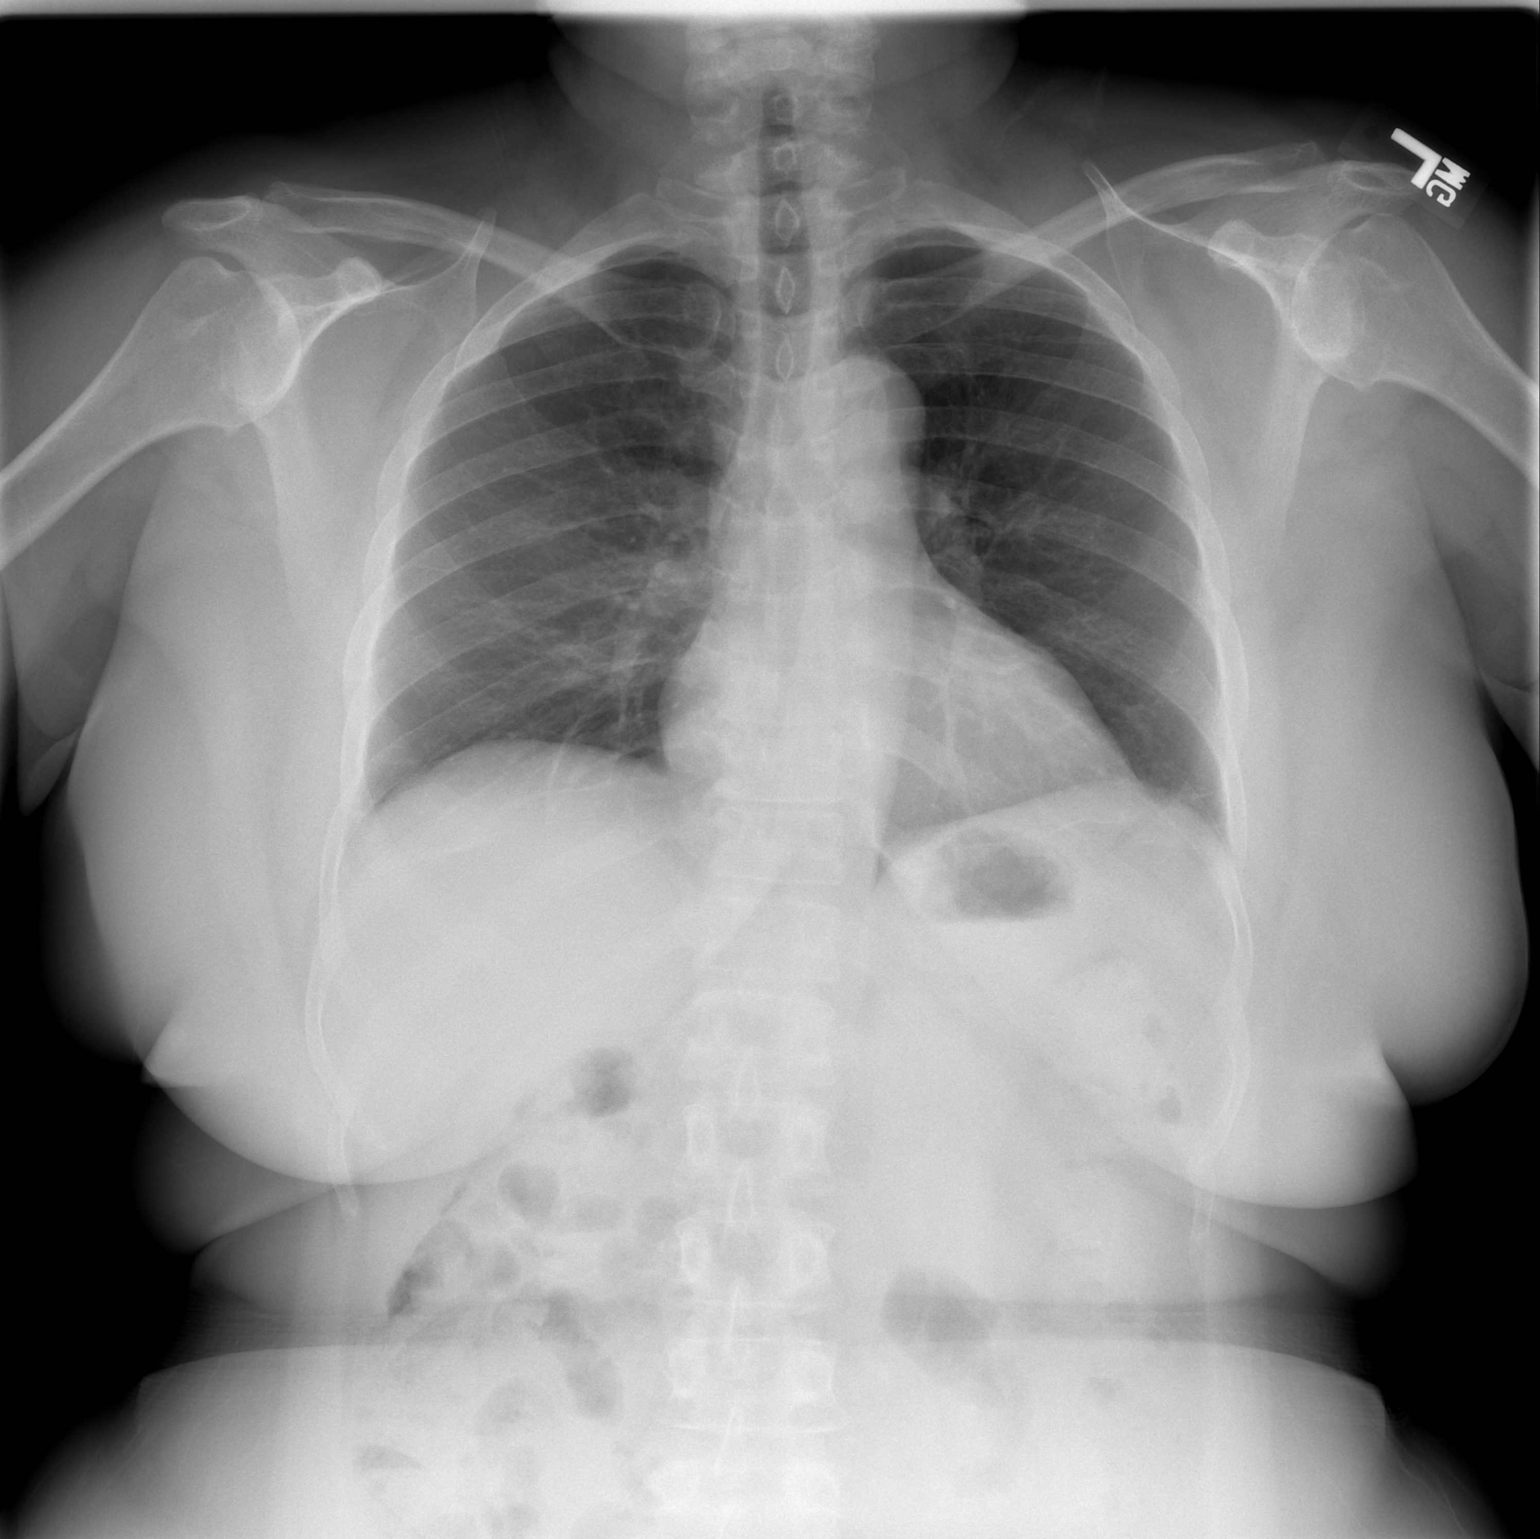

[w chest lat]
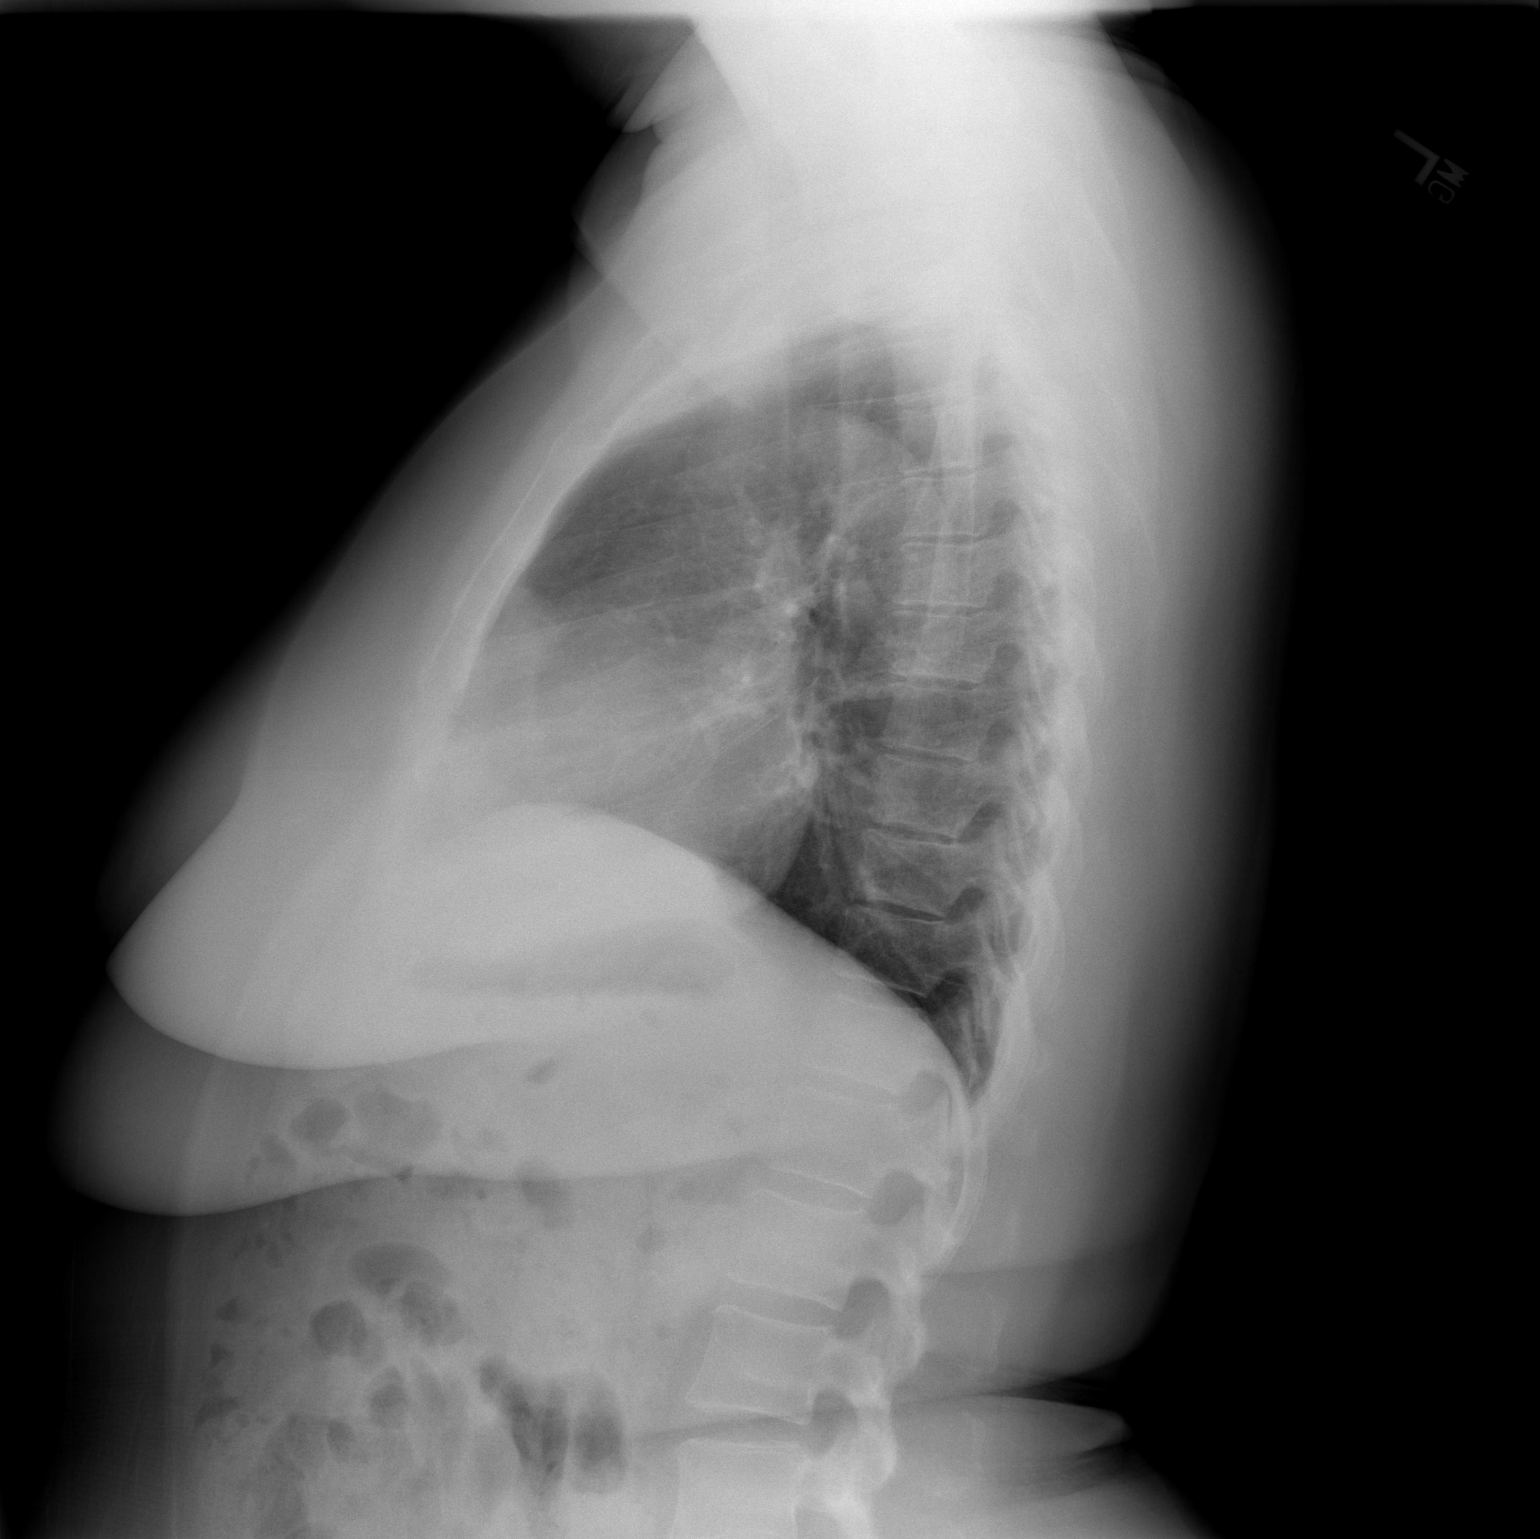

[2 of 2 positions shown; findings below may reference images not displayed]

FINDINGS: The lungs are low volume but clear bilaterally.  No
confluent airspace opacities, pleural effuions or pneumothoracies
are seen.  The heart is normal in size and contour.  The upper
abdomen and osseous structures are normal.
IMPRESSION: No acute cardiopulmonary disease.

## 2010-11-05 ENCOUNTER — Ambulatory Visit (HOSPITAL_COMMUNITY)
Admission: RE | Admit: 2010-11-05 | Discharge: 2010-11-05 | Payer: Self-pay | Source: Home / Self Care | Attending: Cardiology | Admitting: Cardiology

## 2011-01-28 ENCOUNTER — Other Ambulatory Visit: Payer: Self-pay | Admitting: Hematology & Oncology

## 2011-01-28 ENCOUNTER — Encounter (HOSPITAL_BASED_OUTPATIENT_CLINIC_OR_DEPARTMENT_OTHER): Payer: Medicare Other | Admitting: Hematology & Oncology

## 2011-01-28 DIAGNOSIS — D696 Thrombocytopenia, unspecified: Secondary | ICD-10-CM

## 2011-01-28 LAB — CBC WITH DIFFERENTIAL (CANCER CENTER ONLY)
BASO#: 0 10*3/uL (ref 0.0–0.2)
BASO%: 0.3 % (ref 0.0–2.0)
Eosinophils Absolute: 0.1 10*3/uL (ref 0.0–0.5)
HCT: 38 % (ref 34.8–46.6)
HGB: 12.8 g/dL (ref 11.6–15.9)
LYMPH#: 1.2 10*3/uL (ref 0.9–3.3)
NEUT%: 73.7 % (ref 39.6–80.0)
RDW: 15.3 % (ref 11.1–15.7)
WBC: 7.7 10*3/uL (ref 3.9–10.0)

## 2011-01-28 LAB — CHCC SATELLITE - SMEAR

## 2011-07-15 ENCOUNTER — Other Ambulatory Visit: Payer: Self-pay | Admitting: Hematology & Oncology

## 2011-07-15 ENCOUNTER — Encounter (HOSPITAL_BASED_OUTPATIENT_CLINIC_OR_DEPARTMENT_OTHER): Payer: Medicare Other | Admitting: Hematology & Oncology

## 2011-07-15 DIAGNOSIS — D696 Thrombocytopenia, unspecified: Secondary | ICD-10-CM

## 2011-07-15 LAB — CBC WITH DIFFERENTIAL (CANCER CENTER ONLY)
Eosinophils Absolute: 0.1 10*3/uL (ref 0.0–0.5)
HCT: 39.6 % (ref 34.8–46.6)
LYMPH%: 17 % (ref 14.0–48.0)
MCH: 26.9 pg (ref 26.0–34.0)
MCV: 78 fL — ABNORMAL LOW (ref 81–101)
MONO#: 0.4 10*3/uL (ref 0.1–0.9)
MONO%: 6.4 % (ref 0.0–13.0)
NEUT%: 75.6 % (ref 39.6–80.0)
Platelets: 74 10*3/uL — ABNORMAL LOW (ref 145–400)
RBC: 5.1 10*6/uL (ref 3.70–5.32)
WBC: 6.4 10*3/uL (ref 3.9–10.0)

## 2011-07-15 LAB — RETICULOCYTES (CHCC): ABS Retic: 41.4 10*3/uL (ref 19.0–186.0)

## 2011-10-23 ENCOUNTER — Other Ambulatory Visit (HOSPITAL_COMMUNITY): Payer: Self-pay | Admitting: Cardiology

## 2011-10-23 DIAGNOSIS — Z1231 Encounter for screening mammogram for malignant neoplasm of breast: Secondary | ICD-10-CM

## 2011-11-27 ENCOUNTER — Ambulatory Visit (HOSPITAL_COMMUNITY)
Admission: RE | Admit: 2011-11-27 | Discharge: 2011-11-27 | Disposition: A | Discharge status: Home / Self Care | Payer: Medicare Other | Source: Ambulatory Visit | Attending: Cardiology | Admitting: Cardiology

## 2011-11-27 DIAGNOSIS — Z1231 Encounter for screening mammogram for malignant neoplasm of breast: Secondary | ICD-10-CM | POA: Insufficient documentation

## 2012-01-11 ENCOUNTER — Other Ambulatory Visit: Payer: Self-pay | Admitting: Cardiology

## 2012-01-11 DIAGNOSIS — Z780 Asymptomatic menopausal state: Secondary | ICD-10-CM

## 2012-01-13 ENCOUNTER — Other Ambulatory Visit (HOSPITAL_BASED_OUTPATIENT_CLINIC_OR_DEPARTMENT_OTHER): Payer: Medicare Other | Admitting: Lab

## 2012-01-13 ENCOUNTER — Ambulatory Visit (HOSPITAL_BASED_OUTPATIENT_CLINIC_OR_DEPARTMENT_OTHER): Payer: Medicare Other | Admitting: Hematology & Oncology

## 2012-01-13 VITALS — BP 135/87 | HR 90 | Temp 97.5°F | Wt 174.0 lb

## 2012-01-13 DIAGNOSIS — D696 Thrombocytopenia, unspecified: Secondary | ICD-10-CM

## 2012-01-13 LAB — CBC WITH DIFFERENTIAL (CANCER CENTER ONLY)
BASO%: 0.1 % (ref 0.0–2.0)
LYMPH#: 1.2 10*3/uL (ref 0.9–3.3)
MONO#: 0.5 10*3/uL (ref 0.1–0.9)
NEUT#: 5.7 10*3/uL (ref 1.5–6.5)
Platelets: 87 10*3/uL — ABNORMAL LOW (ref 145–400)
RDW: 14.9 % (ref 11.1–15.7)
WBC: 7.5 10*3/uL (ref 3.9–10.0)

## 2012-01-13 LAB — RETICULOCYTES (CHCC): RBC.: 4.97 MIL/uL (ref 3.87–5.11)

## 2012-01-13 NOTE — Progress Notes (Signed)
Diagnosis: Chronic thrombocytopenia  Current therapy: Observation.  Interim history: [name] comes in for followup. We see her every 6 months. So far, we've not discovered any obvious etiology for the thrombocytopenia. She's had no problems with this. She's had no bruising or bleeding. There's been no weight loss weight gain. She's had no cough or shortness of breath. There's been no rashes.  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 There is no fatigue. No weakness. Again, her appetite has been good.  Her physical exam this is a mildly obese black female in no obvious distress. Vital signs 97.5. Heart rate is 90. Blood pressure 135/87. Weight is 174 pounds.  Head and neck exam shows no ocular or oral lesions. There are no palpable cervical or supraclavicular lymph nodes. Lungs are clear bilaterally. Cardiac exam regular rate and rhythm with a normal S1 and S2. There are no murmurs rubs or bruits. Abdominal Z. soft, mildly obese. She has good bowel sounds. There is no palpable hepatospleno megaly. Extremities shows no clubbing cyanosis or edema. Skin exam no rashes ecchymoses or petechia.  Laboratory studies white count is 7.5 hemoglobin 13 hematocrit 39 platelet count 87,000.  Impression: [name] is a 71 year old African female with chronic thrombocytopenia. Her platelet count is stable. We have been following her now for [place] to 6 years.  We'll plan here back in another 6 months for followup. I don't see need for any invasive studies (i.e. bone marrow test).

## 2012-02-16 ENCOUNTER — Ambulatory Visit
Admission: RE | Admit: 2012-02-16 | Discharge: 2012-02-16 | Disposition: A | Discharge status: Home / Self Care | Payer: Medicare Other | Source: Ambulatory Visit | Attending: Cardiology | Admitting: Cardiology

## 2012-02-16 DIAGNOSIS — Z780 Asymptomatic menopausal state: Secondary | ICD-10-CM

## 2012-07-13 ENCOUNTER — Other Ambulatory Visit (HOSPITAL_BASED_OUTPATIENT_CLINIC_OR_DEPARTMENT_OTHER): Payer: Medicare Other | Admitting: Lab

## 2012-07-13 ENCOUNTER — Ambulatory Visit (HOSPITAL_BASED_OUTPATIENT_CLINIC_OR_DEPARTMENT_OTHER): Payer: Medicare Other | Admitting: Medical

## 2012-07-13 VITALS — BP 152/69 | HR 73 | Temp 98.0°F | Resp 18 | Ht 60.0 in | Wt 159.0 lb

## 2012-07-13 DIAGNOSIS — D696 Thrombocytopenia, unspecified: Secondary | ICD-10-CM

## 2012-07-13 DIAGNOSIS — D693 Immune thrombocytopenic purpura: Secondary | ICD-10-CM

## 2012-07-13 LAB — CBC WITH DIFFERENTIAL (CANCER CENTER ONLY)
BASO#: 0 10*3/uL (ref 0.0–0.2)
EOS%: 0.6 % (ref 0.0–7.0)
Eosinophils Absolute: 0 10*3/uL (ref 0.0–0.5)
HCT: 38.6 % (ref 34.8–46.6)
HGB: 13 g/dL (ref 11.6–15.9)
LYMPH%: 13.6 % — ABNORMAL LOW (ref 14.0–48.0)
MCH: 26.7 pg (ref 26.0–34.0)
MCHC: 33.7 g/dL (ref 32.0–36.0)
MCV: 79 fL — ABNORMAL LOW (ref 81–101)
MONO%: 7.3 % (ref 0.0–13.0)
NEUT#: 5.5 10*3/uL (ref 1.5–6.5)
NEUT%: 78.2 % (ref 39.6–80.0)
RBC: 4.86 10*6/uL (ref 3.70–5.32)

## 2012-07-13 NOTE — Progress Notes (Signed)
Diagnoses chronic thrombocytopenia.  Current therapy: Observation.  Interim history: [name] presents for an office followup visit today.  Overall, she, reports, that she is doing quite well.  We continue to see her on an every six-month basis.  We really have not discovered any obvious etiology for the thrombocytopenia.  Her platelet counts have remained stable and are actually 100,000, today.  She's not reporting any new, complications.  She's not reporting any unintentional weight loss, any cough, chest pain, shortness of, breath.  She's not reporting any fevers, chills, or night sweats, or any lymphadenopathy.  She denies any headaches, visual changes, or any type of rashes.  She has a good appetite.  She has good energy.  She denies any nausea, vomiting, diarrhea, constipation.  She denies any obvious, bleeding.  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  Overall, she's doing quite well.  Review of Systems: Pt. Denies any changes in their vision, hearing, adenopathy, fevers, chills, nausea, vomiting, diarrhea, constipation, chest pain, shortness of breath, passing blood, passing out, blacking out,  any changes in skin, joints, neurologic or psychiatric except as noted.  Physical Exam: This is a pleasant, elderly, well-developed, well-nourished, 71 year old, African American, female, in no obvious distress Vitals: Temperature 90.0 degrees, pulse 73, respirations 18, blood pressure 152/69, weight 159 pounds HEENT reveals a normocephalic, atraumatic skull, no scleral icterus, no oral lesions  Neck is supple without any cervical or supraclavicular adenopathy.  Lungs are clear to auscultation bilaterally. There are no wheezes, rales or rhonci Cardiac is regular rate and rhythm with a normal S1 and S2. There are no murmurs, rubs, or bruits.  Abdomen is soft with good bowel sounds, there is no palpable mass. There is no palpable hepatosplenomegaly. There is no palpable fluid wave.    Musculoskeletal no tenderness of the spine, ribs, or hips.  Extremities there are no clubbing, cyanosis, or edema.  Skin no petechia, purpura or ecchymosis Neurologic is nonfocal.  Laboratory Data: White count 7.0, hemoglobin 13.0, hematocrit 30.6, platelets 100,000  Current Outpatient Prescriptions on File Prior to Visit  Medication Sig Dispense Refill  . ALPRAZolam (XANAX) 1 MG tablet Take 1 mg by mouth at bedtime as needed.      Marland Kitchen amLODipine (NORVASC) 5 MG tablet       . amLODipine-valsartan (EXFORGE) 5-320 MG per tablet Take 1 tablet by mouth daily.      Marland Kitchen atorvastatin (LIPITOR) 20 MG tablet Take 20 mg by mouth daily.      Marland Kitchen losartan (COZAAR) 100 MG tablet       . sitaGLIPtan-metformin (JANUMET) 50-500 MG per tablet Take 1 tablet by mouth 2 (two) times daily with a meal.       Assessment/Plan: This is a pleasant, 71 year old, African American, female, with the following issues:  #1 chronic thrombus cytopenia-her platelet count is currently stable.  We have been following her for quite some time now, over 6 years.  We will continue to monitor her counts every 6 months.  Currently, there is not a need for any invasive studies such as a bone marrow, biopsy.  #2 followup-Darlene Jones will follow back up with [place] in 6 months, but before then should there be questions or concerns.

## 2012-11-16 ENCOUNTER — Other Ambulatory Visit (HOSPITAL_COMMUNITY): Payer: Self-pay | Admitting: Cardiology

## 2012-11-16 DIAGNOSIS — Z1231 Encounter for screening mammogram for malignant neoplasm of breast: Secondary | ICD-10-CM

## 2012-11-28 ENCOUNTER — Ambulatory Visit (HOSPITAL_COMMUNITY)
Admission: RE | Admit: 2012-11-28 | Discharge: 2012-11-28 | Disposition: A | Discharge status: Home / Self Care | Payer: Medicare Other | Source: Ambulatory Visit | Attending: Cardiology | Admitting: Cardiology

## 2012-11-28 DIAGNOSIS — Z1231 Encounter for screening mammogram for malignant neoplasm of breast: Secondary | ICD-10-CM | POA: Insufficient documentation

## 2012-12-27 ENCOUNTER — Telehealth: Payer: Self-pay | Admitting: Hematology & Oncology

## 2012-12-27 NOTE — Telephone Encounter (Signed)
Left pt message 3-12 moved to 4-2

## 2013-01-11 ENCOUNTER — Other Ambulatory Visit: Payer: Medicare Other | Admitting: Lab

## 2013-01-11 ENCOUNTER — Ambulatory Visit: Payer: Medicare Other | Admitting: Hematology & Oncology

## 2013-02-01 ENCOUNTER — Other Ambulatory Visit (HOSPITAL_BASED_OUTPATIENT_CLINIC_OR_DEPARTMENT_OTHER): Payer: Medicare Other | Admitting: Lab

## 2013-02-01 ENCOUNTER — Ambulatory Visit (HOSPITAL_BASED_OUTPATIENT_CLINIC_OR_DEPARTMENT_OTHER): Payer: Medicare Other | Admitting: Hematology & Oncology

## 2013-02-01 VITALS — BP 133/74 | HR 84 | Temp 97.4°F | Resp 16 | Ht 60.0 in | Wt 154.0 lb

## 2013-02-01 DIAGNOSIS — D696 Thrombocytopenia, unspecified: Secondary | ICD-10-CM

## 2013-02-01 DIAGNOSIS — D693 Immune thrombocytopenic purpura: Secondary | ICD-10-CM

## 2013-02-01 LAB — RETICULOCYTES (CHCC): RBC.: 4.92 MIL/uL (ref 3.87–5.11)

## 2013-02-01 LAB — CBC WITH DIFFERENTIAL (CANCER CENTER ONLY)
BASO%: 0.2 % (ref 0.0–2.0)
EOS%: 2.2 % (ref 0.0–7.0)
HCT: 39 % (ref 34.8–46.6)
LYMPH#: 0.9 10*3/uL (ref 0.9–3.3)
MCHC: 32.8 g/dL (ref 32.0–36.0)
NEUT%: 76 % (ref 39.6–80.0)
RDW: 15.1 % (ref 11.1–15.7)

## 2013-02-01 NOTE — Progress Notes (Signed)
This office note has been dictated.

## 2013-02-02 NOTE — Progress Notes (Signed)
CC:   Osvaldo Shipper. Spruill, M.D.  DIAGNOSIS:  Chronic thrombocytopenia-possibly mild immune thrombocytopenia.  CURRENT THERAPY:  Observation.  INTERIM HISTORY:  Ms. Brosious comes in for her 83-month followup.  She is doing well.  She has had no problems since we last saw her back in September.  There has been no bleeding or bruising.  She has had no change in bowel or bladder habits.  There has been no cough.  She has had no change in medication.  She is diabetic.  She is on Janumet.  PHYSICAL EXAMINATION:  General:  This is a well-developed, well- nourished African American female in no obvious distress.  Vital signs: Temperature of 97.4, pulse 84, respiratory rate 16, blood pressure 133/74.  Weight is 154.  Head and neck:  Normocephalic, atraumatic skull.  There are no ocular or oral lesions.  There are no palpable cervical or supraclavicular lymph nodes.  Lungs:  Clear to percussion and auscultation bilaterally.  Cardiac:  Regular rate and rhythm with a normal S1 and S2.  There are no murmurs, rubs, or bruits.  Abdomen: Soft with good bowel sounds.  There is no palpable abdominal mass. There is no fluid wave.  No palpable hepatosplenomegaly is noted. Extremities:  No clubbing, cyanosis, or edema.  Neurological:  No focal neurological deficits.  LABORATORY STUDIES:  White cell count is 5.4, hemoglobin 12.8, hematocrit 39, platelet count 90,000.  Peripheral smear does show good maturation of her white blood cells. There are no immature myeloid or lymphoid cells.  There are no atypical lymphocytes.  Red cells are without nucleated red cells.  There is no rouleaux formation.  There are no schistocytes or spherocytes. Platelets are mildly decreased in number.  She has several large platelets.  IMPRESSION:  Ms. Charters is a very charming 72 year old African American female with thrombocytopenia.  We have been following her now probably for a good 2 years or so.  To-date, there has  been no change in her platelet count.  We have actually seen her for about 7 years.  For now, we will plan to get her back in 8 months.  I still do not see any indication for a bone marrow test.    ______________________________ Josph Macho, M.D. PRE/MEDQ  D:  02/01/2013  T:  02/02/2013  Job:  [id number]

## 2013-10-04 ENCOUNTER — Other Ambulatory Visit (HOSPITAL_BASED_OUTPATIENT_CLINIC_OR_DEPARTMENT_OTHER): Payer: Medicare Other | Admitting: Lab

## 2013-10-04 ENCOUNTER — Ambulatory Visit (HOSPITAL_BASED_OUTPATIENT_CLINIC_OR_DEPARTMENT_OTHER): Payer: Medicare Other | Admitting: Hematology & Oncology

## 2013-10-04 ENCOUNTER — Telehealth: Payer: Self-pay | Admitting: Hematology & Oncology

## 2013-10-04 VITALS — BP 124/69 | HR 65 | Temp 97.5°F | Resp 14 | Ht 60.0 in | Wt 156.0 lb

## 2013-10-04 DIAGNOSIS — D693 Immune thrombocytopenic purpura: Secondary | ICD-10-CM

## 2013-10-04 LAB — CBC WITH DIFFERENTIAL (CANCER CENTER ONLY)
BASO#: 0 10*3/uL (ref 0.0–0.2)
Eosinophils Absolute: 0.1 10*3/uL (ref 0.0–0.5)
HGB: 13.3 g/dL (ref 11.6–15.9)
LYMPH#: 1.2 10*3/uL (ref 0.9–3.3)
MCH: 27 pg (ref 26.0–34.0)
NEUT#: 4.3 10*3/uL (ref 1.5–6.5)
RBC: 4.92 10*6/uL (ref 3.70–5.32)

## 2013-10-04 NOTE — Progress Notes (Signed)
This office note has been dictated.

## 2013-10-04 NOTE — Telephone Encounter (Signed)
Per in basket follow up for 6-3 pt changed to 6-10

## 2013-10-08 NOTE — Progress Notes (Signed)
CC:   Osvaldo Shipper. Spruill, M.D.  DIAGNOSIS:  Chronic thrombocytopenia.  CURRENT THERAPY:  Observation.  INTERIM HISTORY:  [name] comes in for followup.  We last saw her back in April.  Since then, she has had no problems.  She does have diabetes.  She says her last hemoglobin A1c was 5.6.  She has had no problem with bleeding.  There has been no bruising.  She has had no weight loss or weight gain.  There has been no change in her medications.  PHYSICAL EXAMINATION:  General:  This is a well-developed, well- nourished [place] American female, in no obvious distress.  Vital Signs: Temperature of 97.5, pulse 85, respiratory rate 14, blood pressure 124/69, weight is 156 pounds.  Head and Neck:  A normocephalic and atraumatic skull.  There are no ocular or oral lesions.  There are no palpable cervical or supraclavicular lymph nodes.  Lungs:  Clear bilaterally.  Cardiac:  Regular rate and rhythm with a normal S1 and S2. There are no murmurs, rubs, or bruits.  Abdomen:  Soft.  She has good bowel sounds.  She is mildly obese.  She has no fluid wave.  There is no palpable hepatosplenomegaly.  Back:  No tenderness over the spine, ribs, or hips.  Extremities:  No clubbing, cyanosis, or edema.  Skin:  No rashes, ecchymosis, or petechiae.  LABORATORY STUDIES:  White blood cell count is 6.1, hemoglobin 13.3, hematocrit 40.3, platelet count 111,000.  MCV is 82.  IMPRESSION:  Darlene Jones is a very nice 72 year old African American female.  She has chronic thrombocytopenia.  We have been watching this now for a couple of years if not longer.  Her platelet count tends to trend between a certain range.  She is asymptomatic.  When I looked at her blood smear I did not see anything that looked suspicious for any bone marrow disorder.  For now, we will plan to get her back in another 6 months or so.  I do not see that we need to get her back any time sooner for any lab work unless she has  symptoms.    ______________________________ Josph Macho, M.D. PRE/MEDQ  D:  10/04/2013  T:  10/08/2013  Job:  [id number]

## 2013-10-08 NOTE — Progress Notes (Signed)
CC:   Darlene Jones. Spruill, M.D.  DIAGNOSIS:  Chronic thrombocytopenia.  CURRENT THERAPY:  Observation.  INTERIM HISTORY:  Darlene Jones comes in for followup.  We last saw her back in April.  Since then, she has had no problems.  She does have diabetes.  She says her last hemoglobin A1c was only 5.6.  She has had no problems with bleeding.  There has been no bruising.  She has had no weight loss or weight gain.  There has been no change in her medications.  PHYSICAL EXAMINATION:  General:  This is a well-developed, well- nourished [place] American female, in no obvious distress.  Vital Signs: Temperature of 97.5, pulse 85, respiratory rate 14, blood pressure 124/69, weight is 156 pounds.  Head and Neck:  Normocephalic, atraumatic skull.  There are no ocular or oral lesions.  There are no palpable cervical or supraclavicular lymph nodes.  Lungs:  Clear bilaterally. Cardiac:  Regular rate and rhythm with a normal S1 and S2.  There are no murmurs, rubs, or bruits.  Abdomen:  Soft.  She has good bowel sounds. She is mildly obese.  She has no fluid wave.  There is no palpable hepatosplenomegaly.  Back:  No tenderness over the spine, ribs, or hips. Extremities:  No clubbing, cyanosis, or edema.  Skin:  No rashes, ecchymoses, or petechiae.  LABORATORY STUDIES:  White cell count is 6.1, hemoglobin 13.3, hematocrit 40.3, platelet count 111,000.  MCV is 82.  IMPRESSION:  Darlene Jones is a very nice 72 year old African American female.  She has chronic thrombocytopenia.  We have been watching this now for a couple of years if not longer.  Her platelet count tends to trend between a certain range.  She is asymptomatic.  When I look through her blood smear, I do not see anything that looks suspicious for any bone marrow disorder.  For now, we will plan to get her back in another 6 months or so.  I do not see that we need to get her back any time sooner for any lab work unless she has  symptoms.    ______________________________ Josph Macho, M.D. PRE/MEDQ  D:  10/04/2013  T:  10/08/2013  Job:  [id number]

## 2013-11-13 ENCOUNTER — Other Ambulatory Visit (HOSPITAL_COMMUNITY): Payer: Self-pay | Admitting: Cardiology

## 2013-11-13 DIAGNOSIS — Z1231 Encounter for screening mammogram for malignant neoplasm of breast: Secondary | ICD-10-CM

## 2013-11-29 ENCOUNTER — Ambulatory Visit (HOSPITAL_COMMUNITY)
Admission: RE | Admit: 2013-11-29 | Discharge: 2013-11-29 | Disposition: A | Discharge status: Home / Self Care | Payer: Medicare Other | Source: Ambulatory Visit | Attending: Cardiology | Admitting: Cardiology

## 2013-11-29 DIAGNOSIS — Z1231 Encounter for screening mammogram for malignant neoplasm of breast: Secondary | ICD-10-CM | POA: Insufficient documentation

## 2014-04-11 ENCOUNTER — Ambulatory Visit (HOSPITAL_BASED_OUTPATIENT_CLINIC_OR_DEPARTMENT_OTHER): Payer: Medicare Other | Admitting: Hematology & Oncology

## 2014-04-11 ENCOUNTER — Other Ambulatory Visit (HOSPITAL_BASED_OUTPATIENT_CLINIC_OR_DEPARTMENT_OTHER): Payer: Medicare Other | Admitting: Lab

## 2014-04-11 ENCOUNTER — Encounter: Payer: Self-pay | Admitting: Hematology & Oncology

## 2014-04-11 VITALS — BP 142/65 | HR 57 | Temp 97.9°F | Resp 14 | Ht 60.0 in | Wt 157.0 lb

## 2014-04-11 DIAGNOSIS — D693 Immune thrombocytopenic purpura: Secondary | ICD-10-CM

## 2014-04-11 DIAGNOSIS — D696 Thrombocytopenia, unspecified: Secondary | ICD-10-CM

## 2014-04-11 LAB — CBC WITH DIFFERENTIAL (CANCER CENTER ONLY)
BASO#: 0 10*3/uL (ref 0.0–0.2)
BASO%: 0.3 % (ref 0.0–2.0)
EOS%: 2.5 % (ref 0.0–7.0)
Eosinophils Absolute: 0.2 10*3/uL (ref 0.0–0.5)
HCT: 39.9 % (ref 34.8–46.6)
HGB: 13.3 g/dL (ref 11.6–15.9)
LYMPH#: 1.2 10*3/uL (ref 0.9–3.3)
LYMPH%: 17.5 % (ref 14.0–48.0)
MCH: 27.4 pg (ref 26.0–34.0)
MCHC: 33.3 g/dL (ref 32.0–36.0)
MCV: 82 fL (ref 81–101)
MONO#: 0.6 10*3/uL (ref 0.1–0.9)
MONO%: 8.2 % (ref 0.0–13.0)
NEUT#: 4.8 10*3/uL (ref 1.5–6.5)
NEUT%: 71.5 % (ref 39.6–80.0)
Platelets: 112 10*3/uL — ABNORMAL LOW (ref 145–400)
RBC: 4.85 10*6/uL (ref 3.70–5.32)
RDW: 14.3 % (ref 11.1–15.7)
WBC: 6.7 10*3/uL (ref 3.9–10.0)

## 2014-04-11 LAB — TECHNOLOGIST REVIEW CHCC SATELLITE

## 2014-04-11 LAB — CHCC SATELLITE - SMEAR

## 2014-04-11 NOTE — Progress Notes (Signed)
Hematology and Oncology Follow Up Visit  Darlene Jones [id number] 07/25/1941 73 y.o. 04/11/2014   Principle Diagnosis:   Chronic thrombocytopenia  Current Therapy:    Observation     Interim History:  Ms.  Jones is back for followup. We see her every 6 months. She's doing okay. She's had no problems since we last saw her. She does have diabetes. She is on metformin for this.  She's had no bleeding. The no bruises. She's had no problems with weight loss weight gain. There's been no change in bowel or bladder habits.    Medications: Current outpatient prescriptions:ALPRAZolam (XANAX) 0.25 MG tablet, Take 0.25 mg by mouth at bedtime as needed for sleep., Disp: , Rfl: ;  amLODipine (NORVASC) 5 MG tablet, 5 mg daily. , Disp: , Rfl: ;  atorvastatin (LIPITOR) 20 MG tablet, Take 20 mg by mouth daily., Disp: , Rfl: ;  losartan (COZAAR) 100 MG tablet, 100 mg daily. , Disp: , Rfl:  MetFORMIN & Diet Manage Prod (METFORMIN HCL-NUTRITIONAL SUPL) 500 MG MISC, Take by mouth 2 (two) times daily., Disp: , Rfl: ;  naproxen (NAPROSYN) 500 MG tablet, Take 500 mg by mouth as needed., Disp: , Rfl:   Allergies: No Known Allergies  Past Medical History, Surgical history, Social history, and Family History were reviewed and updated.  Review of Systems: As above  Physical Exam:  height is 5' (1.524 m) and weight is 157 lb (71.215 kg). Her oral temperature is 97.9 F (36.6 C). Her blood pressure is 142/65 and her pulse is 57. Her respiration is 14.    well-developed and well-nourished African American female. Her lungs are clear. Cardiac exam regular in rhythm. There are no murmurs rubs or bruits. Abdomen is soft. She's good bowel sounds. Is no fluid wave. There is no palpable liver or spleen to. Back exam no tenderness over the spine was or hips. Oral exam shows no mucositis. Lymph node exam shows no palpable lymphadenopathy. Extremities shows no clubbing cyanosis or edema. Skin exam no rashes, ecchymosis  or petechia. Neurological exam is nonfocal.  Lab Results  Component Value Date   WBC 6.7 04/11/2014   HGB 13.3 04/11/2014   HCT 39.9 04/11/2014   MCV 82 04/11/2014   PLT 112* 04/11/2014     Chemistry   No results found for this basename: NA, K, CL, CO2, BUN, CREATININE, GLU   No results found for this basename: CALCIUM, ALKPHOS, AST, ALT, BILITOT         Impression and Plan: Darlene Jones is 73-year-old African American female. She is chronic thrombocytopenia. We have been following her now for over 5 years. Her platelet count has fluctuated but really has never shown a tendency to go down.  I really think we can get her back in one year now.  If all looks good in one year, then I don't think we have to get her back.  Is no indication for a bone marrow biopsy.  When I look at her blood smear, I do not see anything that looks suspicious. Her platelets appeared well matured. She has several large platelets. Platelets are well granulated.   ENNEVER,PETER R, MD 6/10/201511:02 AM 

## 2014-11-12 ENCOUNTER — Other Ambulatory Visit (HOSPITAL_COMMUNITY): Payer: Self-pay | Admitting: Cardiology

## 2014-11-12 DIAGNOSIS — Z1231 Encounter for screening mammogram for malignant neoplasm of breast: Secondary | ICD-10-CM

## 2014-11-30 ENCOUNTER — Ambulatory Visit (HOSPITAL_COMMUNITY)
Admission: RE | Admit: 2014-11-30 | Discharge: 2014-11-30 | Disposition: A | Discharge status: Home / Self Care | Payer: BLUE CROSS/BLUE SHIELD | Source: Ambulatory Visit | Attending: Cardiology | Admitting: Cardiology

## 2014-11-30 DIAGNOSIS — Z1231 Encounter for screening mammogram for malignant neoplasm of breast: Secondary | ICD-10-CM

## 2015-04-10 ENCOUNTER — Ambulatory Visit (HOSPITAL_BASED_OUTPATIENT_CLINIC_OR_DEPARTMENT_OTHER): Payer: Medicare Other | Admitting: Family

## 2015-04-10 ENCOUNTER — Other Ambulatory Visit (HOSPITAL_BASED_OUTPATIENT_CLINIC_OR_DEPARTMENT_OTHER): Payer: Medicare Other

## 2015-04-10 VITALS — BP 143/75 | HR 83 | Temp 98.3°F | Resp 20 | Wt 157.0 lb

## 2015-04-10 DIAGNOSIS — D693 Immune thrombocytopenic purpura: Secondary | ICD-10-CM

## 2015-04-10 DIAGNOSIS — D696 Thrombocytopenia, unspecified: Secondary | ICD-10-CM | POA: Diagnosis not present

## 2015-04-10 LAB — CHCC SATELLITE - SMEAR

## 2015-04-10 LAB — CBC WITH DIFFERENTIAL (CANCER CENTER ONLY)
BASO#: 0 10*3/uL (ref 0.0–0.2)
BASO%: 0.2 % (ref 0.0–2.0)
EOS%: 0.9 % (ref 0.0–7.0)
Eosinophils Absolute: 0.1 10*3/uL (ref 0.0–0.5)
HCT: 38.1 % (ref 34.8–46.6)
HEMOGLOBIN: 12.8 g/dL (ref 11.6–15.9)
LYMPH#: 1 10*3/uL (ref 0.9–3.3)
LYMPH%: 18.3 % (ref 14.0–48.0)
MCH: 27.7 pg (ref 26.0–34.0)
MCHC: 33.6 g/dL (ref 32.0–36.0)
MCV: 83 fL (ref 81–101)
MONO#: 0.4 10*3/uL (ref 0.1–0.9)
MONO%: 6.9 % (ref 0.0–13.0)
NEUT%: 73.7 % (ref 39.6–80.0)
NEUTROS ABS: 4.2 10*3/uL (ref 1.5–6.5)
RBC: 4.62 10*6/uL (ref 3.70–5.32)
RDW: 14.4 % (ref 11.1–15.7)
WBC: 5.7 10*3/uL (ref 3.9–10.0)

## 2015-04-10 NOTE — Progress Notes (Signed)
Hematology and Oncology Follow Up Visit  Darlene Jones [id number] 05-28-1941 74 y.o. 04/10/2015   Principle Diagnosis:  Chronic thrombocytopenia  Current Therapy:   Observation    Interim History:  Darlene Jones is here today for a follow-up. She is doing well and has no complaints at this time. She has had no episodes of bleeding or bruising. Her platelet count is up at 142.  She denies fatigue. No problem with infections. She has had no problem with infections. No headaches, blurred vision, rash, cough, SOB, chest pain, palpitations, abdominal pain, constipation, diarrhea, blood in urine or stool.  No swelling, tenderness, numbness or tingling in her extremities.  No medication changes. She is still on Metformin and states that her blood sugars are under control. She states that her last her last Hgb A1c was 6.8.  She is eating well and staying hydrated. Her weight is stable.   Medications:    Medication List       This list is accurate as of: 04/10/15 10:33 AM.  Always use your most recent med list.               amLODipine 5 MG tablet  Commonly known as:  NORVASC  5 mg daily.     atorvastatin 20 MG tablet  Commonly known as:  LIPITOR  Take 20 mg by mouth daily.     losartan 100 MG tablet  Commonly known as:  COZAAR  100 mg daily.     Metformin HCl-Nutritional Supl 500 MG Misc  Take by mouth 2 (two) times daily.     NAPROSYN 500 MG tablet  Generic drug:  naproxen  Take 500 mg by mouth as needed.     XANAX 0.25 MG tablet  Generic drug:  ALPRAZolam  Take 0.25 mg by mouth at bedtime as needed for sleep.        Allergies: No Known Allergies  Past Medical History, Surgical history, Social history, and Family History were reviewed and updated.  Review of Systems: All other 10 point review of systems is negative.   Physical Exam:  weight is 157 lb (71.215 kg). Her oral temperature is 98.3 F (36.8 C). Her blood pressure is 143/75 and her pulse is 83. Her  respiration is 20.   Wt Readings from Last 3 Encounters:  04/10/15 157 lb (71.215 kg)  04/11/14 157 lb (71.215 kg)  10/04/13 156 lb (70.761 kg)    Ocular: Sclerae unicteric, pupils equal, round and reactive to light Ear-nose-throat: Oropharynx clear, dentition fair Lymphatic: No cervical or supraclavicular adenopathy Lungs no rales or rhonchi, good excursion bilaterally Heart regular rate and rhythm, no murmur appreciated Abd soft, nontender, positive bowel sounds MSK no focal spinal tenderness, no joint edema Neuro: non-focal, well-oriented, appropriate affect Breasts: Deferred  Lab Results  Component Value Date   WBC 5.7 04/10/2015   HGB 12.8 04/10/2015   HCT 38.1 04/10/2015   MCV 83 04/10/2015   PLT 142 Platelet count consistent in citrate* 04/10/2015   No results found for: FERRITIN, IRON, TIBC, UIBC, IRONPCTSAT Lab Results  Component Value Date   RETICCTPCT 0.8 02/01/2013   RBC 4.62 04/10/2015   RETICCTABS 39.4 02/01/2013   No results found for: KPAFRELGTCHN, LAMBDASER, KAPLAMBRATIO No results found for: Kandis Cocking, IGMSERUM Lab Results  Component Value Date   TOTALPROTELP 6.7 03/31/2006   ALBUMINELP 61.4 03/31/2006   A1GS 4.2 03/31/2006   A2GS 10.1 03/31/2006   BETS 5.9 03/31/2006   BETA2SER 6.5 03/31/2006   GAMS 11.9  03/31/2006   MSPIKE NOT DET 03/31/2006   SPEI * 03/31/2006     Chemistry   No results found for: NA, K, CL, CO2, BUN, CREATININE, GLU No results found for: CALCIUM, ALKPHOS, AST, ALT, BILITOT   Impression and Plan: Darlene Jones is a 74 year old African American female with chronic thrombocytopenia. So far this has not been an issue for her. She is doing well and is asymptomatic at this time. She has had no episodes of bruising or bleeding.  Her platelet count is improved at 142. No anemia.  We will plan to see her back in 1 year for follow-up and lab work.  She knows to contact us with any questions or concerns. We can certainly see her  sooner if need be.   Eliezer Bottom, NP 6/8/201610:33 AM

## 2015-11-05 ENCOUNTER — Other Ambulatory Visit: Payer: Self-pay

## 2015-11-05 DIAGNOSIS — Z1231 Encounter for screening mammogram for malignant neoplasm of breast: Secondary | ICD-10-CM

## 2015-12-02 ENCOUNTER — Ambulatory Visit
Admission: RE | Admit: 2015-12-02 | Discharge: 2015-12-02 | Disposition: A | Discharge status: Home / Self Care | Payer: Medicare Other | Source: Ambulatory Visit

## 2015-12-02 DIAGNOSIS — Z1231 Encounter for screening mammogram for malignant neoplasm of breast: Secondary | ICD-10-CM

## 2016-04-09 ENCOUNTER — Ambulatory Visit (HOSPITAL_BASED_OUTPATIENT_CLINIC_OR_DEPARTMENT_OTHER): Payer: Medicare Other | Admitting: Hematology & Oncology

## 2016-04-09 ENCOUNTER — Encounter: Payer: Self-pay | Admitting: Hematology & Oncology

## 2016-04-09 ENCOUNTER — Other Ambulatory Visit (HOSPITAL_BASED_OUTPATIENT_CLINIC_OR_DEPARTMENT_OTHER): Payer: Medicare Other

## 2016-04-09 VITALS — BP 137/73 | HR 79 | Temp 97.5°F | Resp 18 | Ht 60.0 in | Wt 154.0 lb

## 2016-04-09 DIAGNOSIS — D696 Thrombocytopenia, unspecified: Secondary | ICD-10-CM | POA: Diagnosis not present

## 2016-04-09 DIAGNOSIS — D693 Immune thrombocytopenic purpura: Secondary | ICD-10-CM

## 2016-04-09 LAB — CBC WITH DIFFERENTIAL (CANCER CENTER ONLY)
BASO#: 0 10*3/uL (ref 0.0–0.2)
BASO%: 0.2 % (ref 0.0–2.0)
EOS%: 1 % (ref 0.0–7.0)
Eosinophils Absolute: 0.1 10*3/uL (ref 0.0–0.5)
HCT: 40.4 % (ref 34.8–46.6)
HGB: 13.5 g/dL (ref 11.6–15.9)
LYMPH#: 1.1 10*3/uL (ref 0.9–3.3)
LYMPH%: 19.1 % (ref 14.0–48.0)
MCH: 27.2 pg (ref 26.0–34.0)
MCHC: 33.4 g/dL (ref 32.0–36.0)
MCV: 82 fL (ref 81–101)
MONO#: 0.4 10*3/uL (ref 0.1–0.9)
MONO%: 7.3 % (ref 0.0–13.0)
NEUT%: 72.4 % (ref 39.6–80.0)
NEUTROS ABS: 4.2 10*3/uL (ref 1.5–6.5)
Platelets: 154 10*3/uL (ref 145–400)
RBC: 4.96 10*6/uL (ref 3.70–5.32)
RDW: 14.8 % (ref 11.1–15.7)
WBC: 5.9 10*3/uL (ref 3.9–10.0)

## 2016-04-09 LAB — CHCC SATELLITE - SMEAR

## 2016-04-09 NOTE — Progress Notes (Signed)
Hematology and Oncology Follow Up Visit  Shanquilla Cadman [id number] 11-12-1940 75 y.o. 04/09/2016   Principle Diagnosis:   Chronic thrombocytopenia  Current Therapy:    Observation     Interim History:  Ms.  Deroy is back for followup. She is doing quite well. We saw her a year ago. She's had no problems since we saw her. There's been no bleeding or bruising. She is on metformin for hyperglycemia. She's had no change in medications.  She's had no issues with weight gain or weight loss. She's had no nausea or vomiting. She does get her mammograms routinely. She's had no change in bowel or bladder habits. There's not been any leg swelling. She's had no rashes.  Overall, her performance status is ECOG 0.  Medications:  Current outpatient prescriptions:  .  ALPRAZolam (XANAX) 0.25 MG tablet, Take 0.25 mg by mouth at bedtime as needed for sleep., Disp: , Rfl:  .  amLODipine (NORVASC) 5 MG tablet, 5 mg daily. , Disp: , Rfl:  .  losartan (COZAAR) 100 MG tablet, 100 mg daily. , Disp: , Rfl:  .  MetFORMIN & Diet Manage Prod (METFORMIN HCL-NUTRITIONAL SUPL) 500 MG MISC, Take by mouth 2 (two) times daily., Disp: , Rfl:   Allergies:  Allergies  Allergen Reactions  . Codeine Nausea Only    Past Medical History, Surgical history, Social history, and Family History were reviewed and updated.  Review of Systems: As above  Physical Exam:  height is 5' (1.524 m) and weight is 154 lb (69.854 kg). Her oral temperature is 97.5 F (36.4 C). Her blood pressure is 137/73 and her pulse is 79. Her respiration is 18.    well-developed and well-nourished [place] American female. Her lungs are clear. Cardiac exam regular in rhythm. There are no murmurs rubs or bruits. Abdomen is soft. She's good bowel sounds. Is no fluid wave. There is no palpable liver or spleen to. Back exam no tenderness over the spine was or hips. Oral exam shows no mucositis. Lymph node exam shows no palpable lymphadenopathy.  Extremities shows no clubbing cyanosis or edema. Skin exam no rashes, ecchymosis or petechia. Neurological exam is nonfocal.  Lab Results  Component Value Date   WBC 5.9 04/09/2016   HGB 13.5 04/09/2016   HCT 40.4 04/09/2016   MCV 82 04/09/2016   PLT 154 Platelet count consistent in citrate 04/09/2016     Chemistry   No results found for: NA No results found for: CALCIUM       Impression and Plan: Ms. Fankhauser is 75 year old African American female. She is chronic thrombocytopenia. We have been following her now for over 6 years. Her platelet count has continued to go up slowly but surely. She's been totally asymptomatic.  I think we can probably let her go for the practice. I does don't thing that we are helping with her medical care. We are not adding anything to her medical care that she is not getting already from her primary doctor.  It has been a lot of fun seeing her. I enjoyed our fellowship together.  I told her that she was come back to see [place] if she has any problems with bruising, bleeding or just not feeling well overall.    Volanda Napoleon, MD 6/8/201711:02 AM

## 2016-05-15 ENCOUNTER — Ambulatory Visit
Admission: RE | Admit: 2016-05-15 | Discharge: 2016-05-15 | Disposition: A | Discharge status: Home / Self Care | Payer: Medicare Other | Source: Ambulatory Visit | Attending: Cardiology | Admitting: Cardiology

## 2016-05-15 ENCOUNTER — Other Ambulatory Visit: Payer: Self-pay | Admitting: Cardiology

## 2016-05-15 DIAGNOSIS — R0602 Shortness of breath: Secondary | ICD-10-CM

## 2016-05-15 IMAGING — CR DG CHEST 2V
2 series · 2 of 2 positions shown · non-contrast
Comparison: September 16, 2010

CLINICAL DATA: Four month history of shortness of breath

EXAM:
CHEST  2 VIEW

[w chest pa]
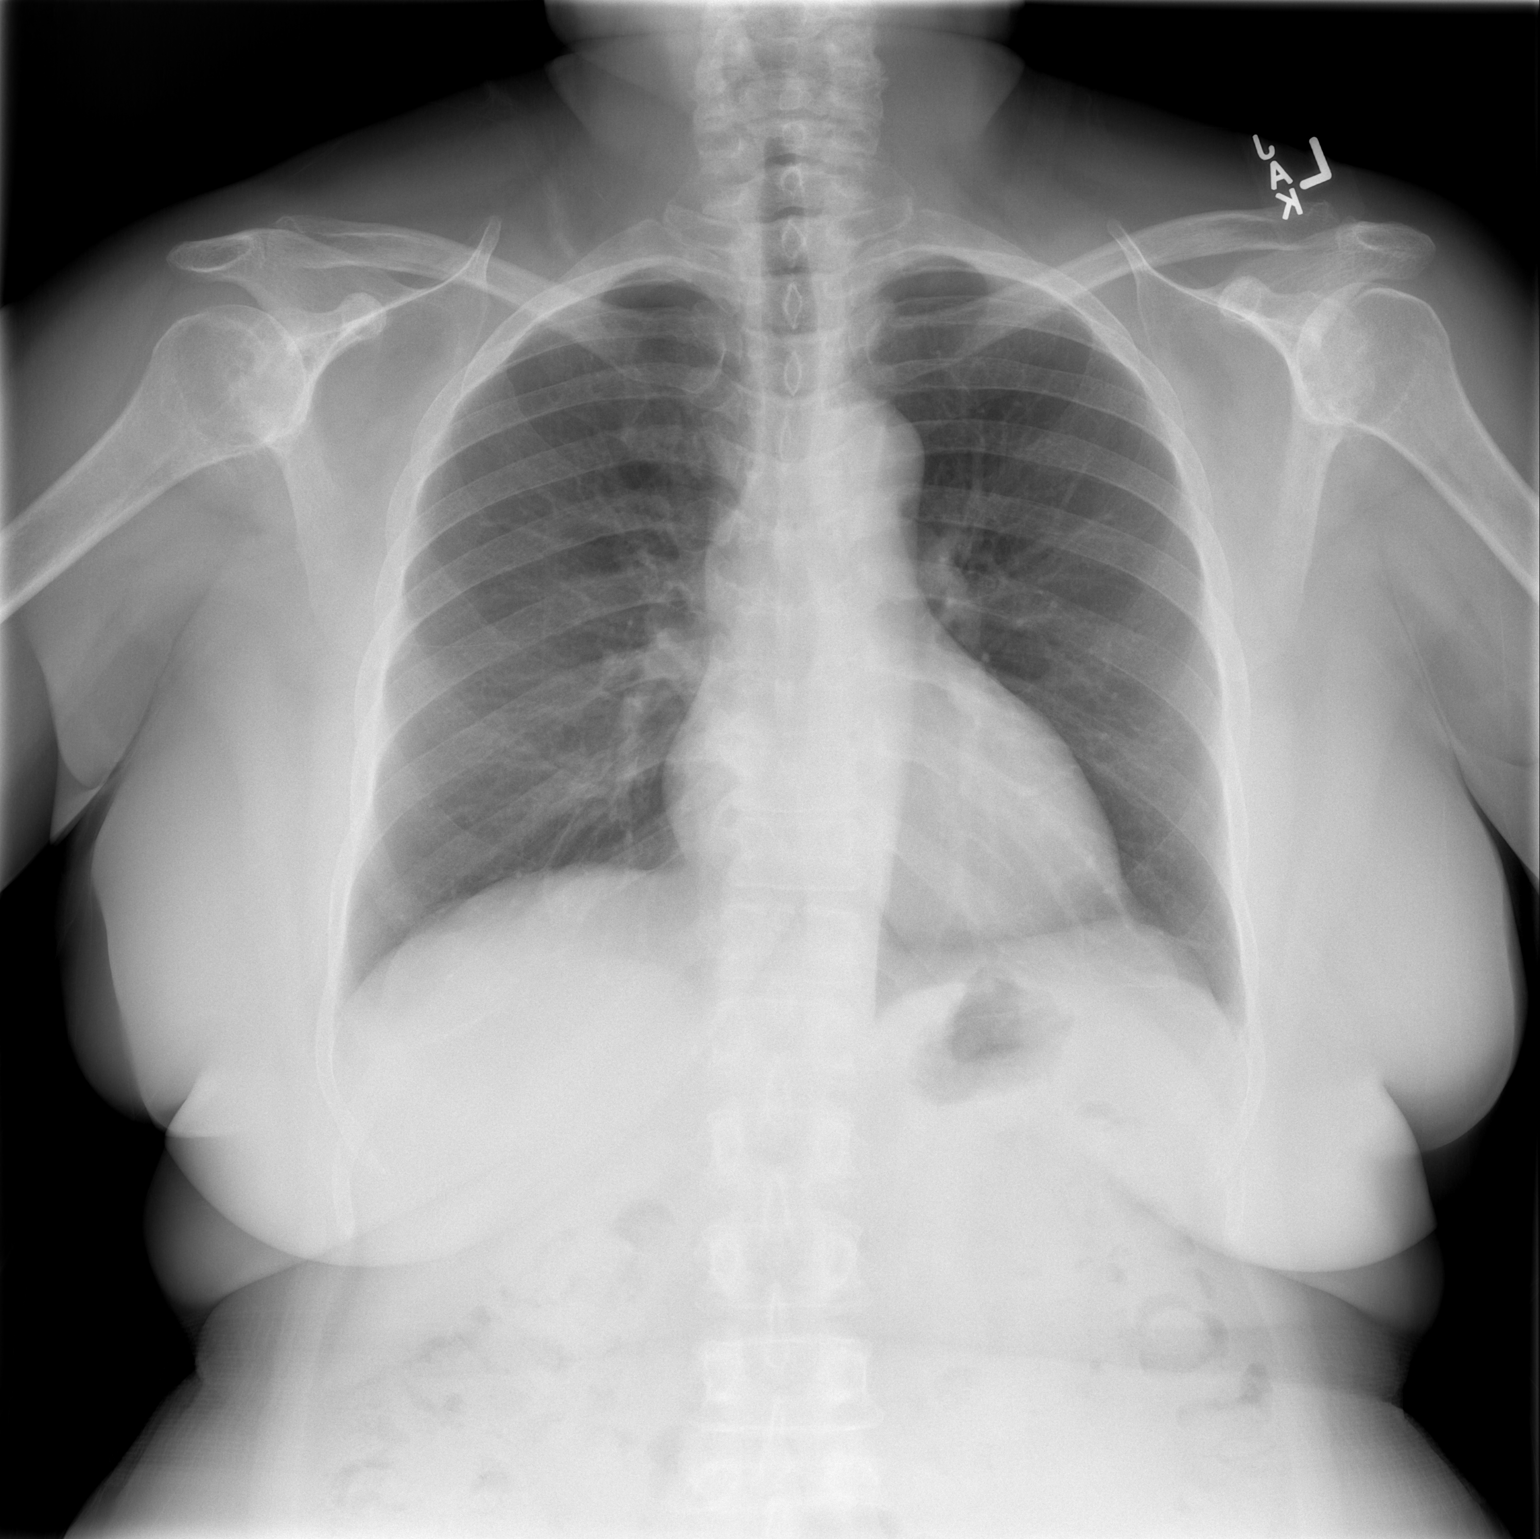

[w chest lat]
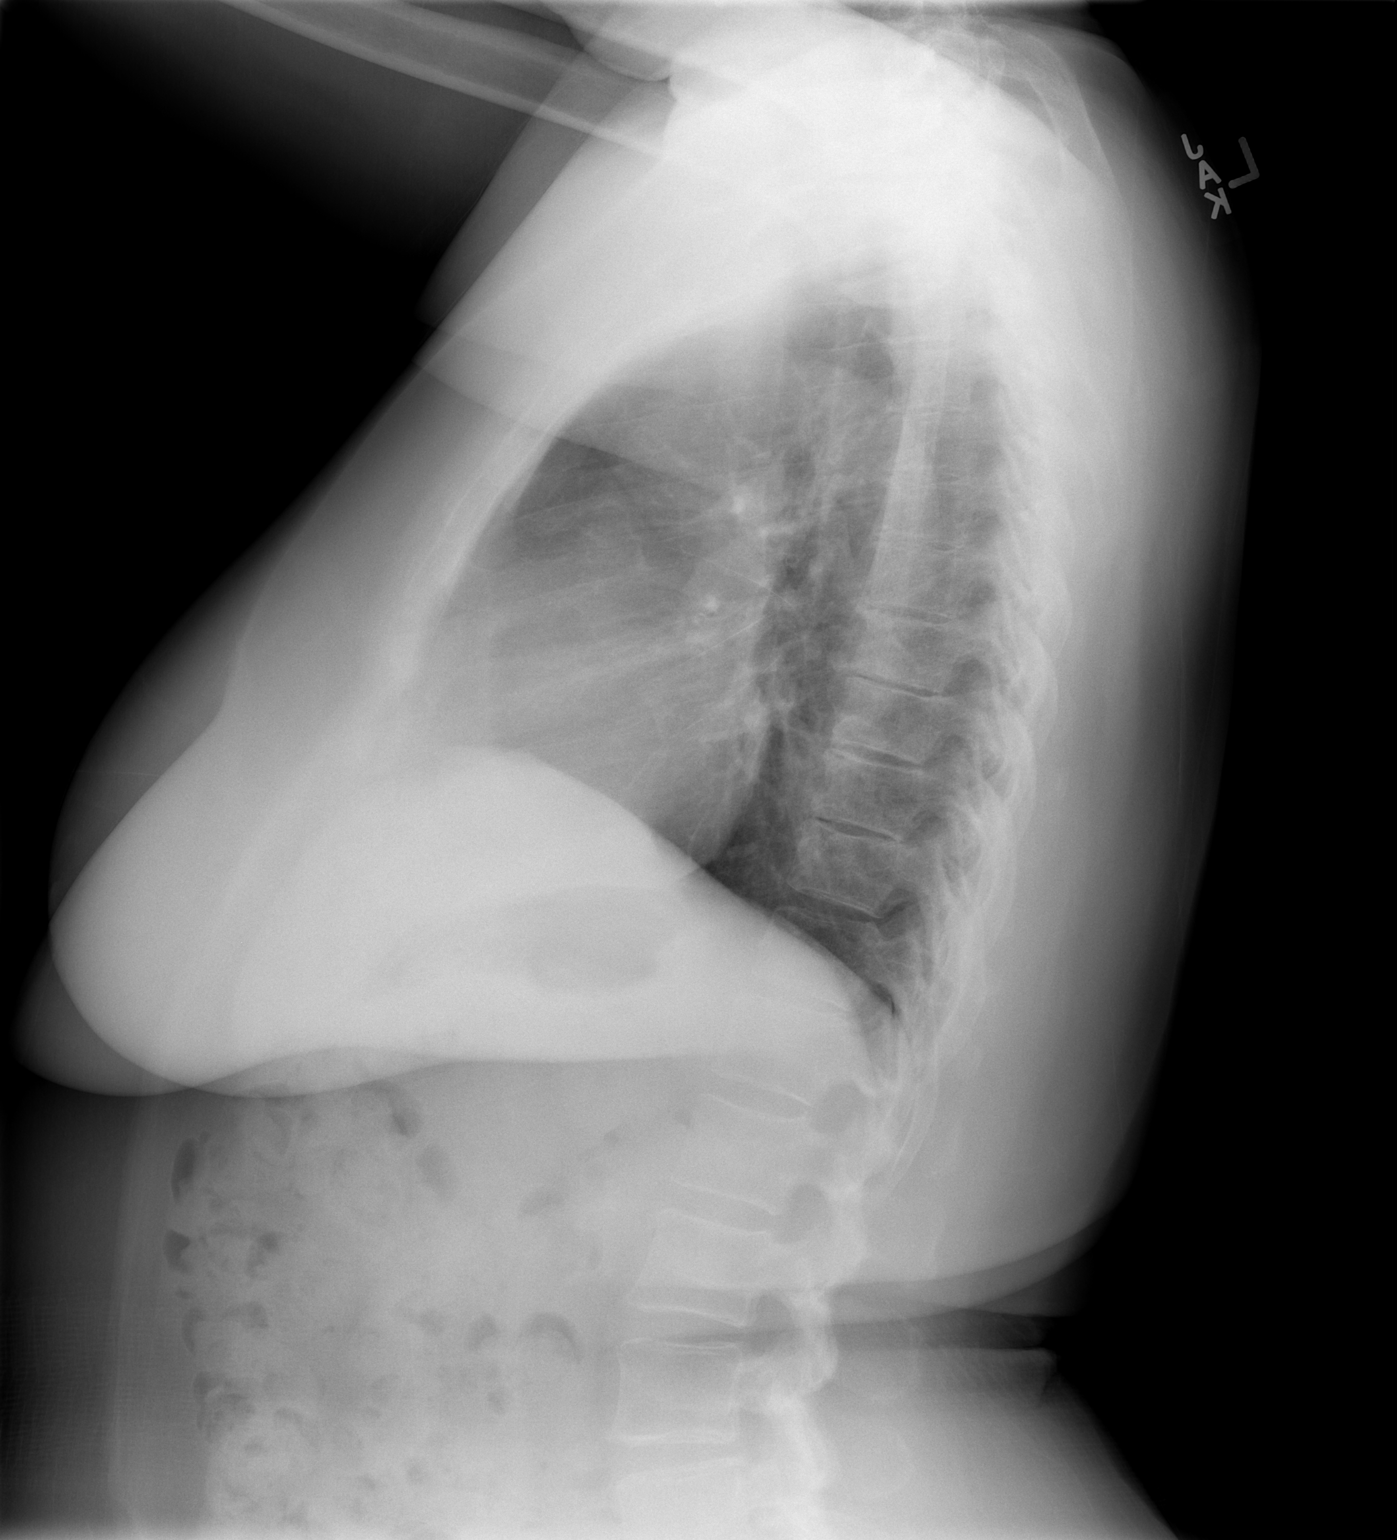

[2 of 2 positions shown; findings below may reference images not displayed]

FINDINGS: There is minimal scarring in the left base. Lungs elsewhere clear.
Heart size and pulmonary vascularity are normal. No adenopathy. No
bone lesions.
IMPRESSION: Minimal scarring left base.  No edema or consolidation.

## 2016-08-17 ENCOUNTER — Ambulatory Visit
Admission: RE | Admit: 2016-08-17 | Discharge: 2016-08-17 | Disposition: A | Discharge status: Home / Self Care | Payer: Medicare Other | Source: Ambulatory Visit | Attending: Cardiology | Admitting: Cardiology

## 2016-08-17 ENCOUNTER — Other Ambulatory Visit: Payer: Self-pay | Admitting: Cardiology

## 2016-08-17 DIAGNOSIS — M2550 Pain in unspecified joint: Secondary | ICD-10-CM

## 2016-08-17 IMAGING — CR DG HAND COMPLETE 3+V*L*
3 series · 3 of 3 positions shown · non-contrast
Comparison: None.

CLINICAL DATA: Chronic pain

EXAM:
LEFT HAND - COMPLETE 3+ VIEW

[x hand pa left]
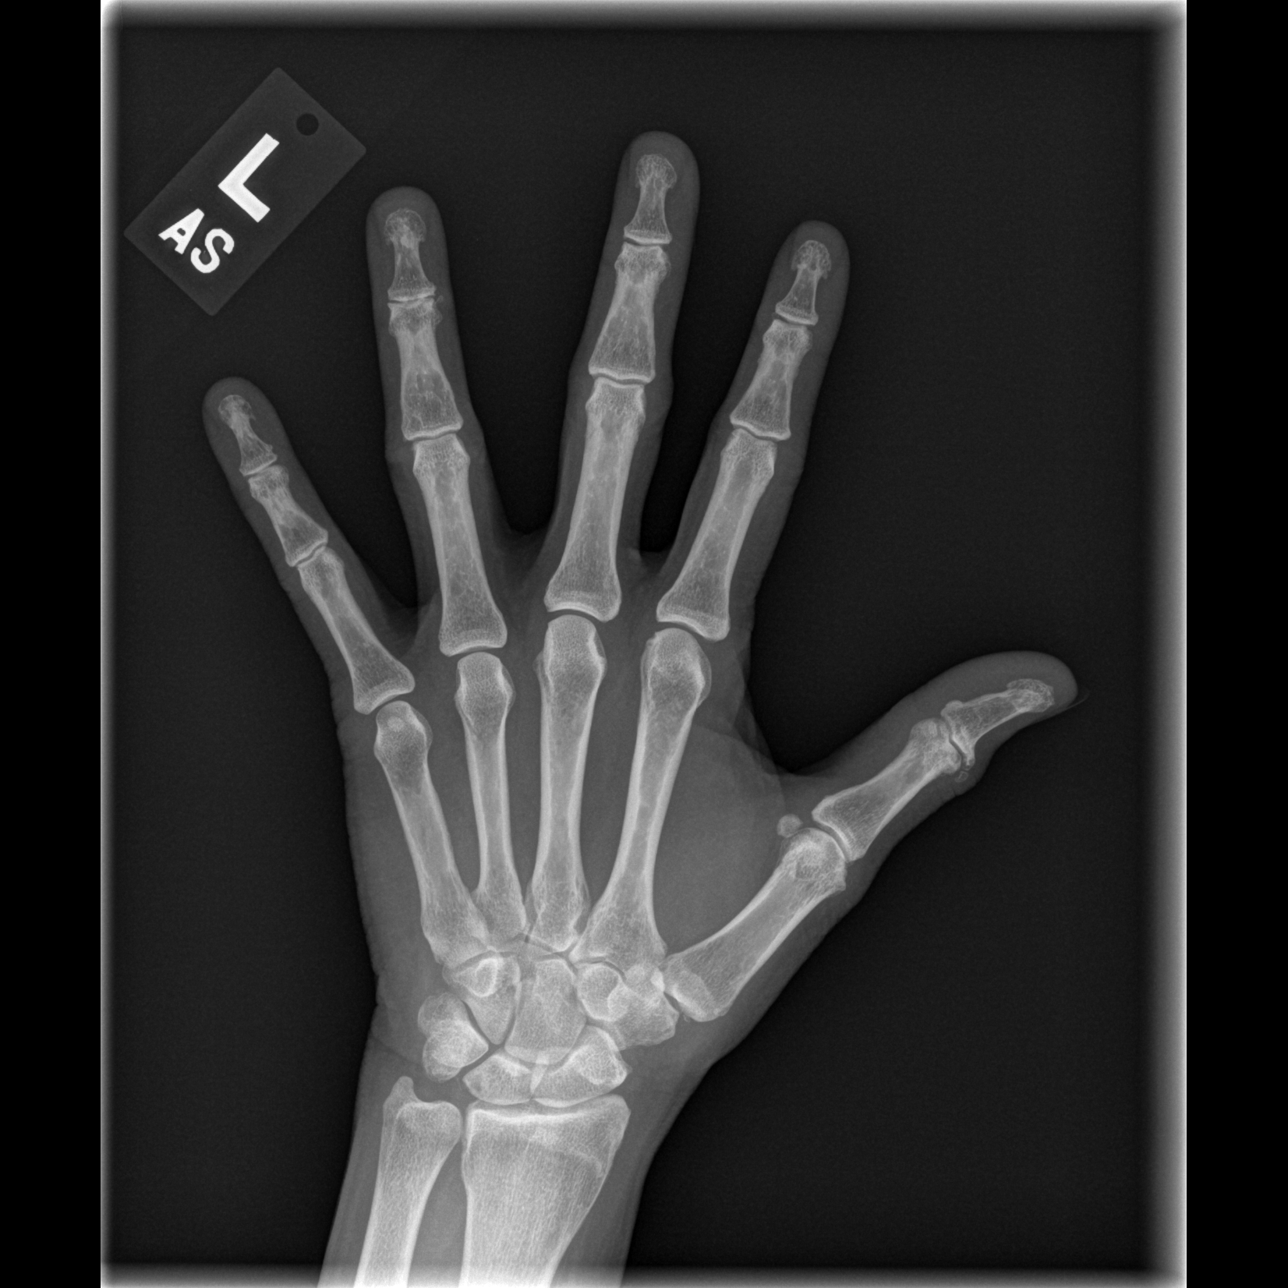

[x hand oblique left]
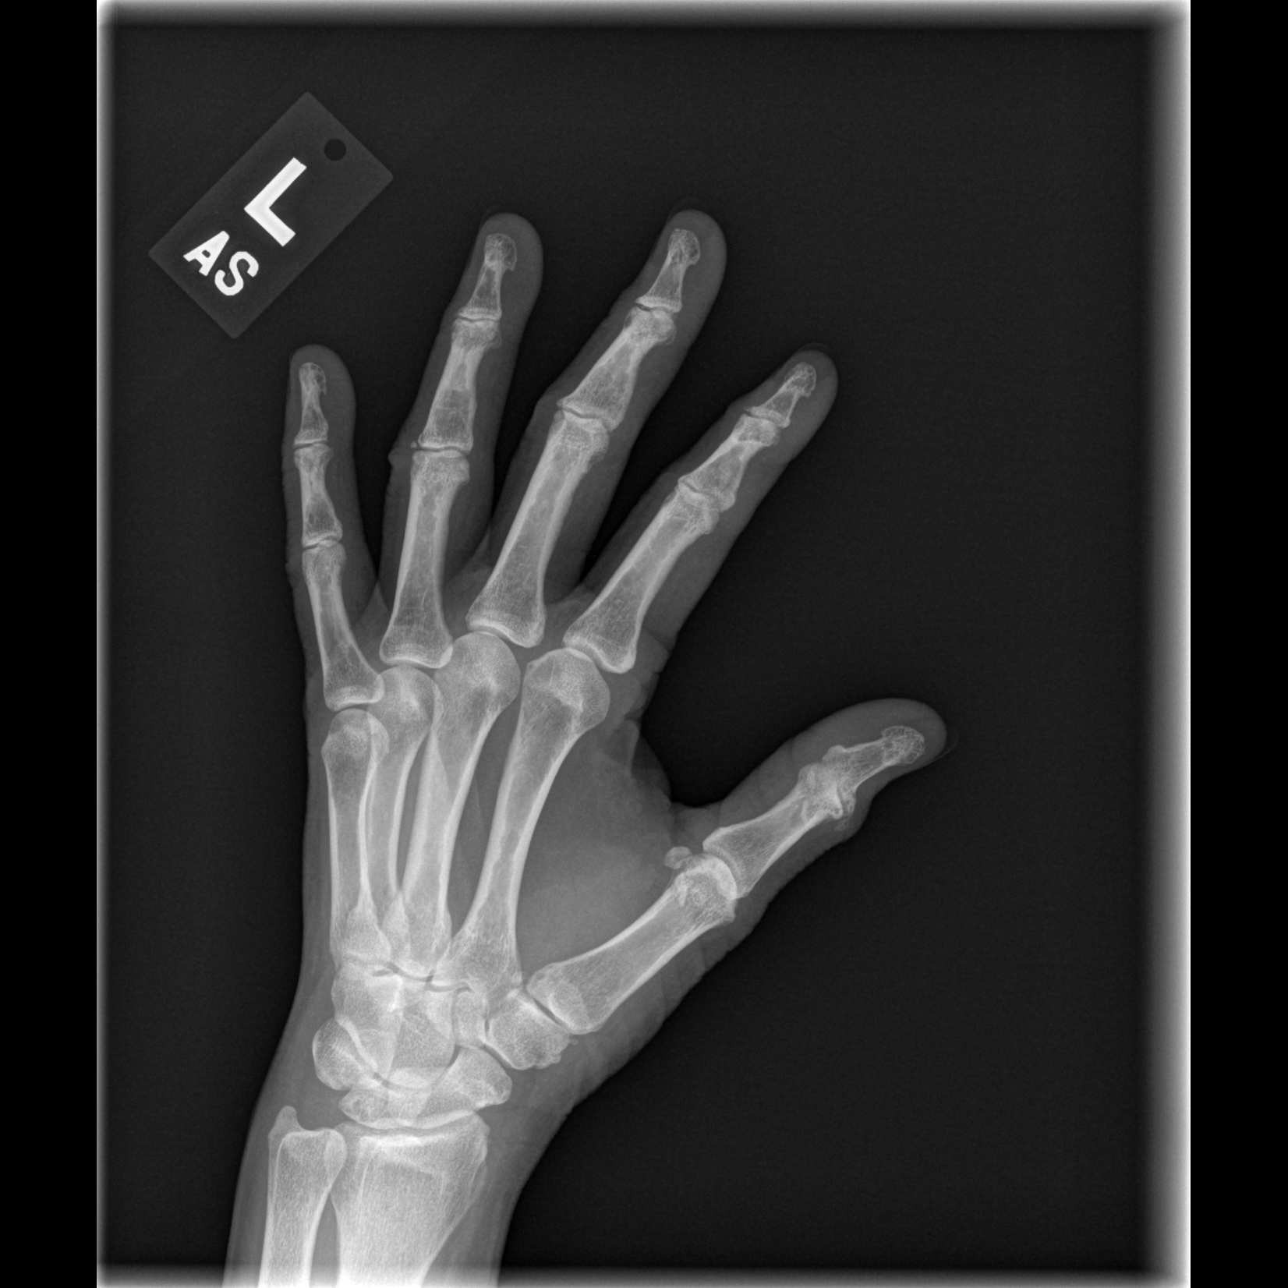

[x hand lat left]
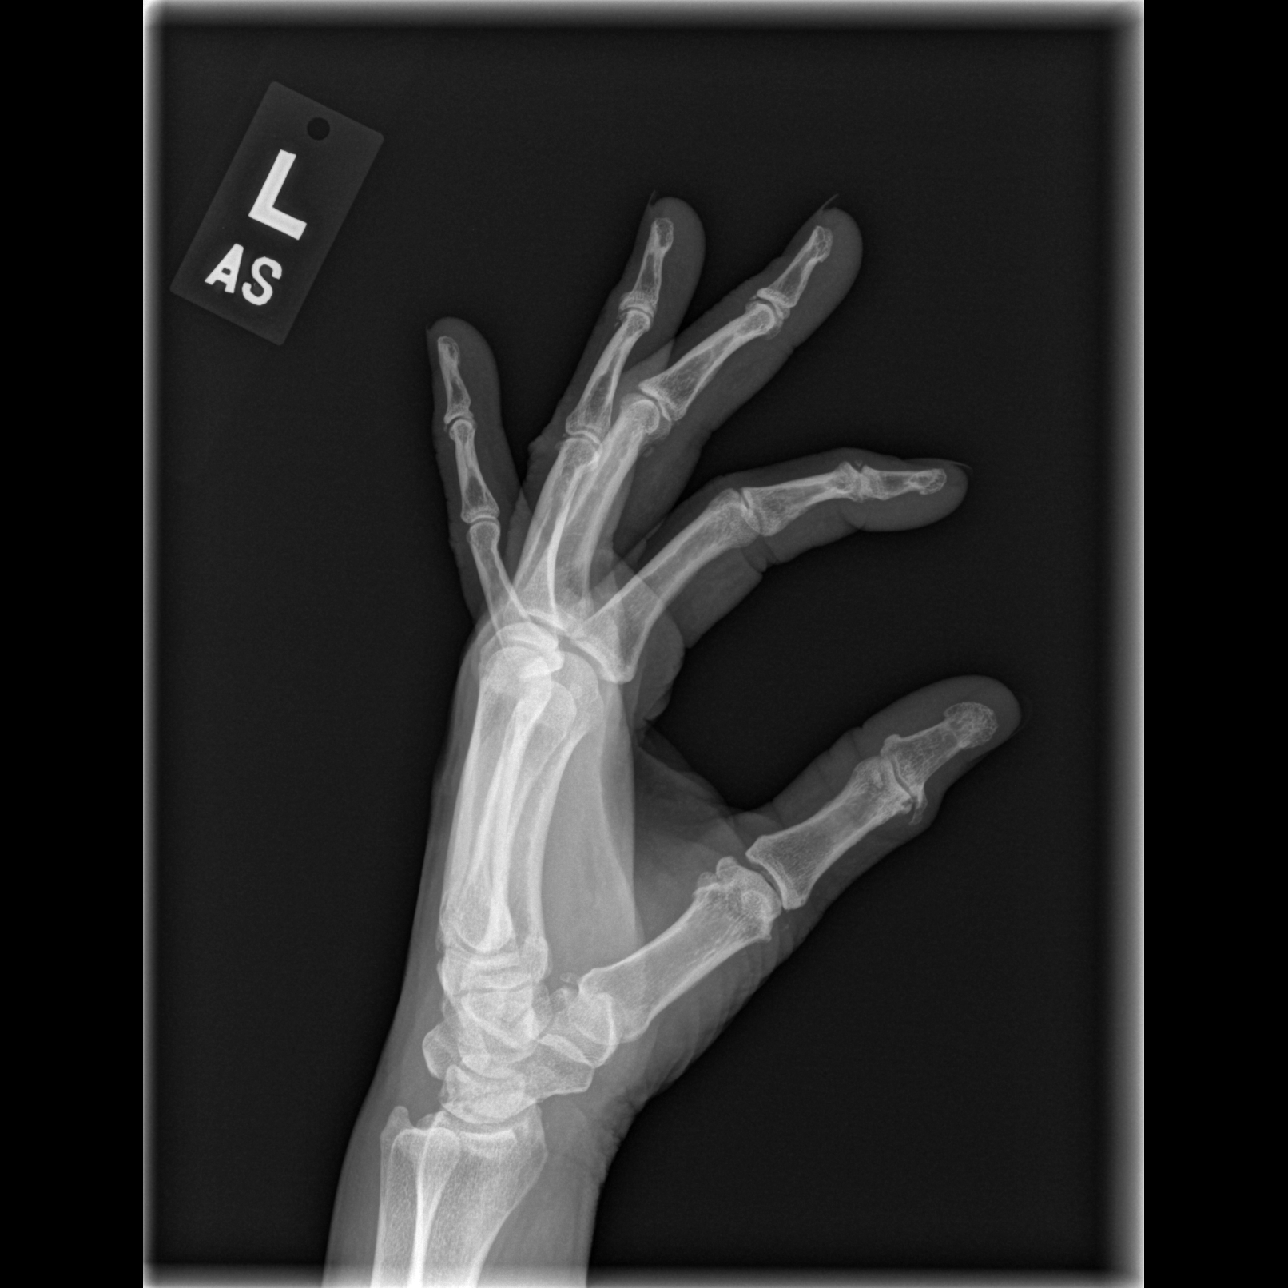

[3 of 3 positions shown; findings below may reference images not displayed]

FINDINGS: Frontal, oblique, and lateral views were obtained. There is no
evident fracture or dislocation. There is a spur along the lateral
aspect of the first IP joint. Small foci of calcification are noted
in all DIP joints as well as in the fourth PIP joint. There is no
erosive change or periostitis. There is no appreciable joint space
narrowing. The bony mineralization appears normal.
IMPRESSION: Small foci of intra-articular calcification at multiple sites,
probably secondary to osteoarthritic type change. There is spurring
in the first IP joint. No significant joint space narrowing is seen,
however. No fracture or dislocation. Bony mineralization normal. No
erosive change or periostitis.

## 2016-08-17 IMAGING — CR DG SHOULDER 2+V*L*
3 series · 3 of 3 positions shown · non-contrast
Comparison: 05/15/2016.

CLINICAL DATA: Chronic pain.  No prior injury.

EXAM:
LEFT SHOULDER - 2+ VIEW

[w shoulder ap internal left *]
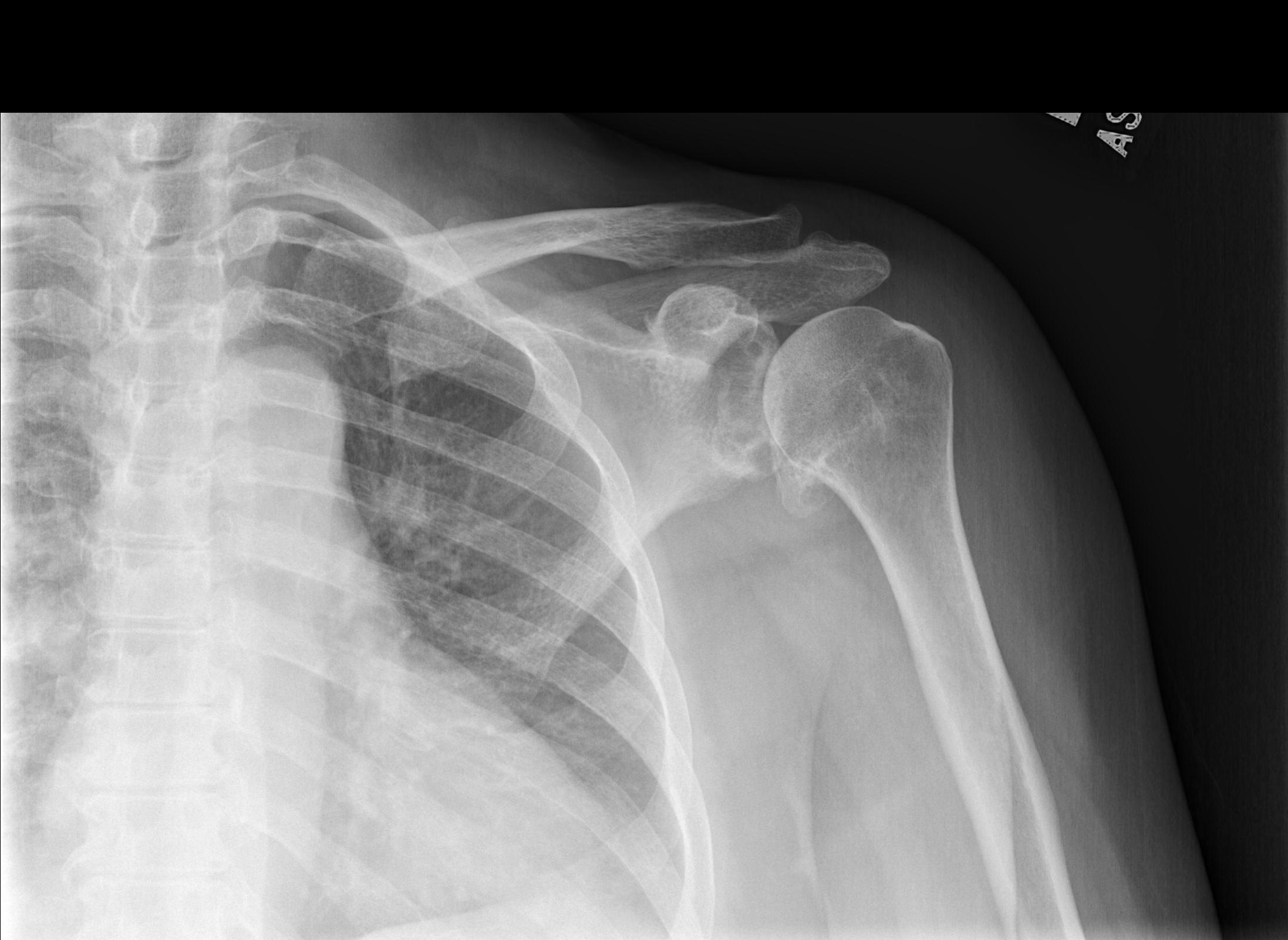

[w shoulder y view left *]
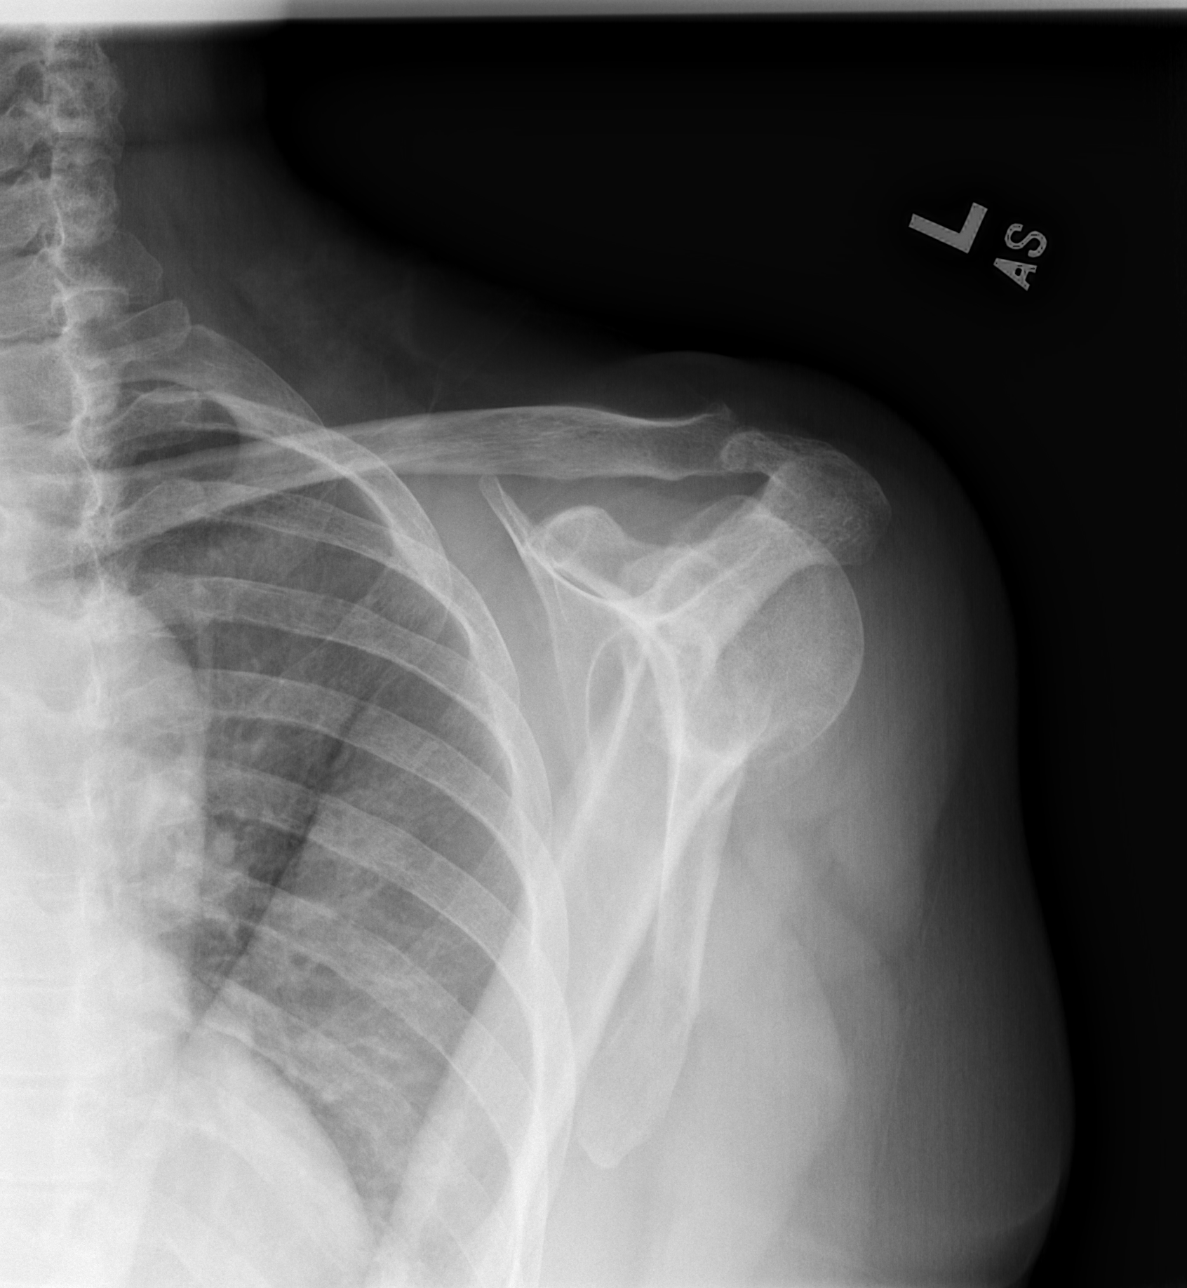

[w shoulder axillary left *]
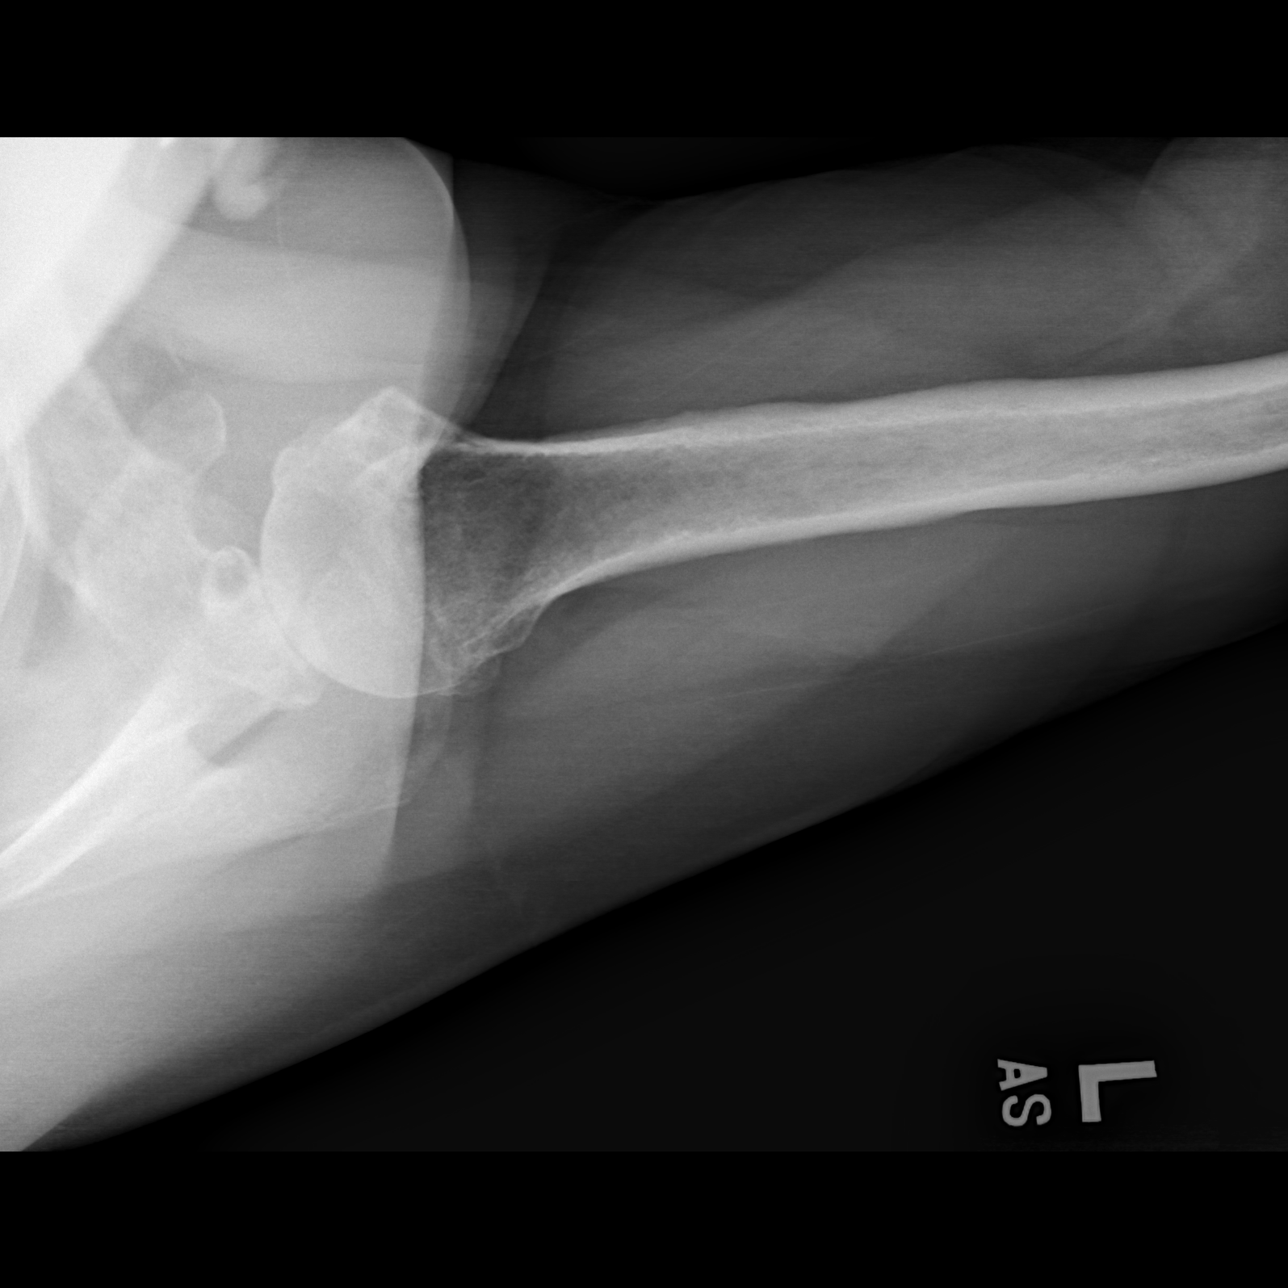

[3 of 3 positions shown; findings below may reference images not displayed]

FINDINGS: Prominent glenohumeral degenerative change present. Mild
acromioclavicular degenerative change. No evidence of acute fracture
or dislocation.
IMPRESSION: Prominent glenohumeral degenerative change. Mild acromioclavicular
degenerative change. No acute abnormality identified.

## 2016-08-17 IMAGING — CR DG HAND COMPLETE 3+V*R*
3 series · 3 of 3 positions shown · non-contrast
Comparison: None.

CLINICAL DATA: Chronic bilateral hand pain, evaluate for arthritis

EXAM:
RIGHT HAND - COMPLETE 3+ VIEW

[x hand pa right]
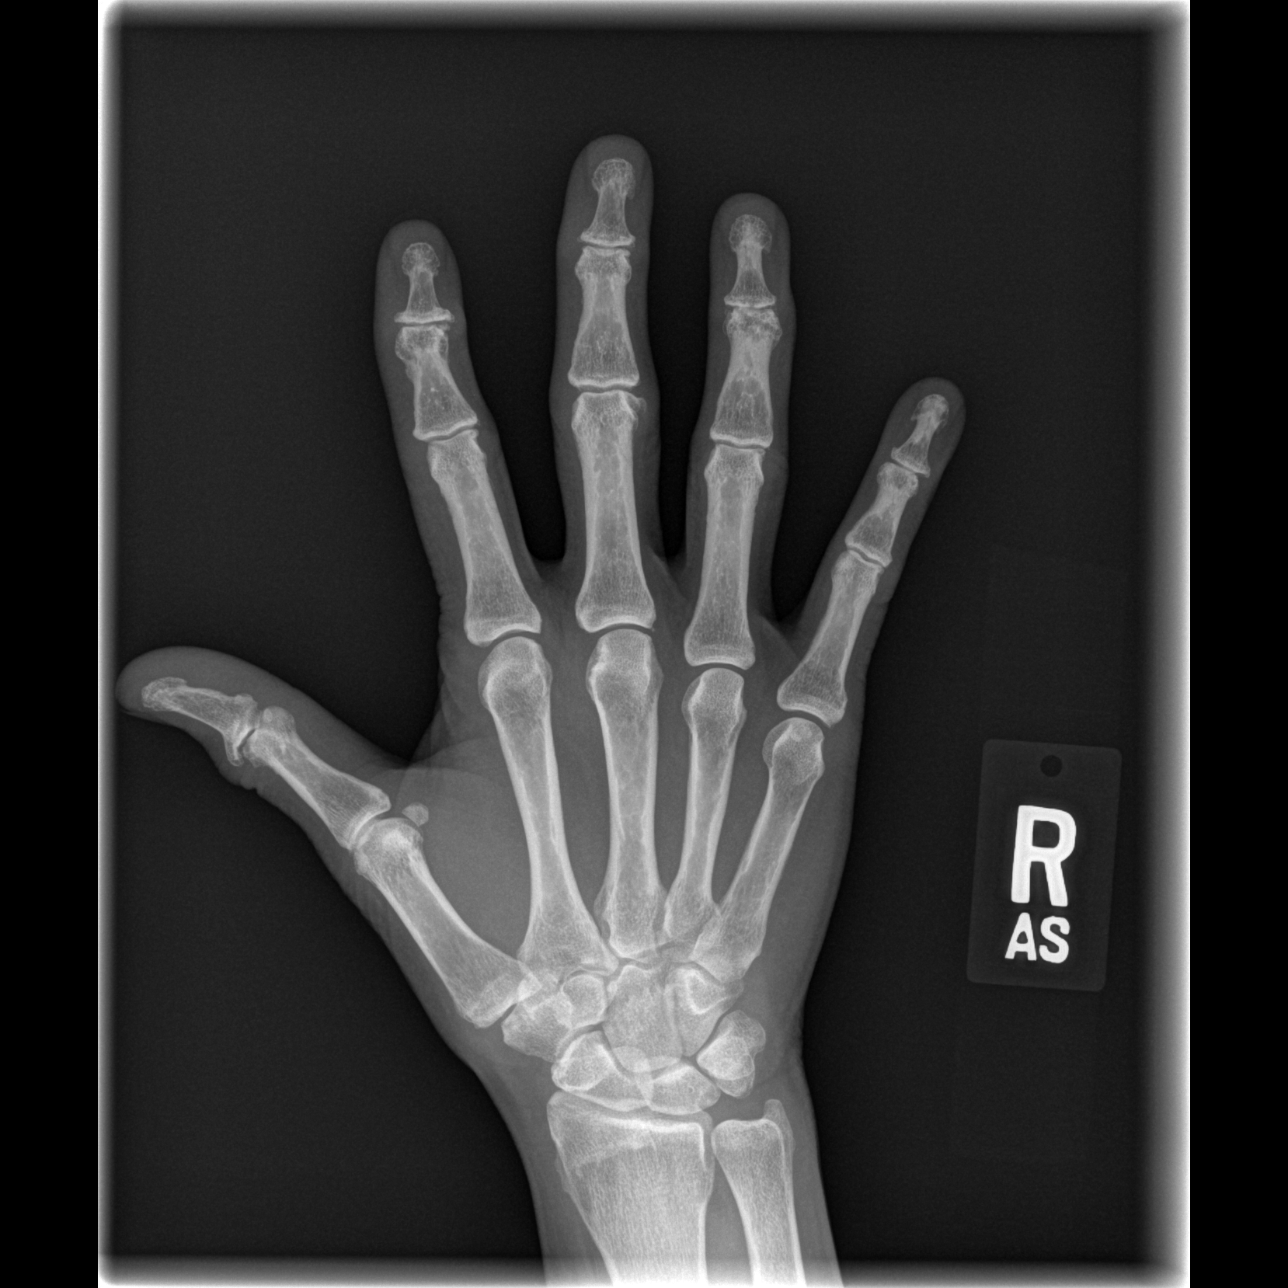

[x hand oblique right]
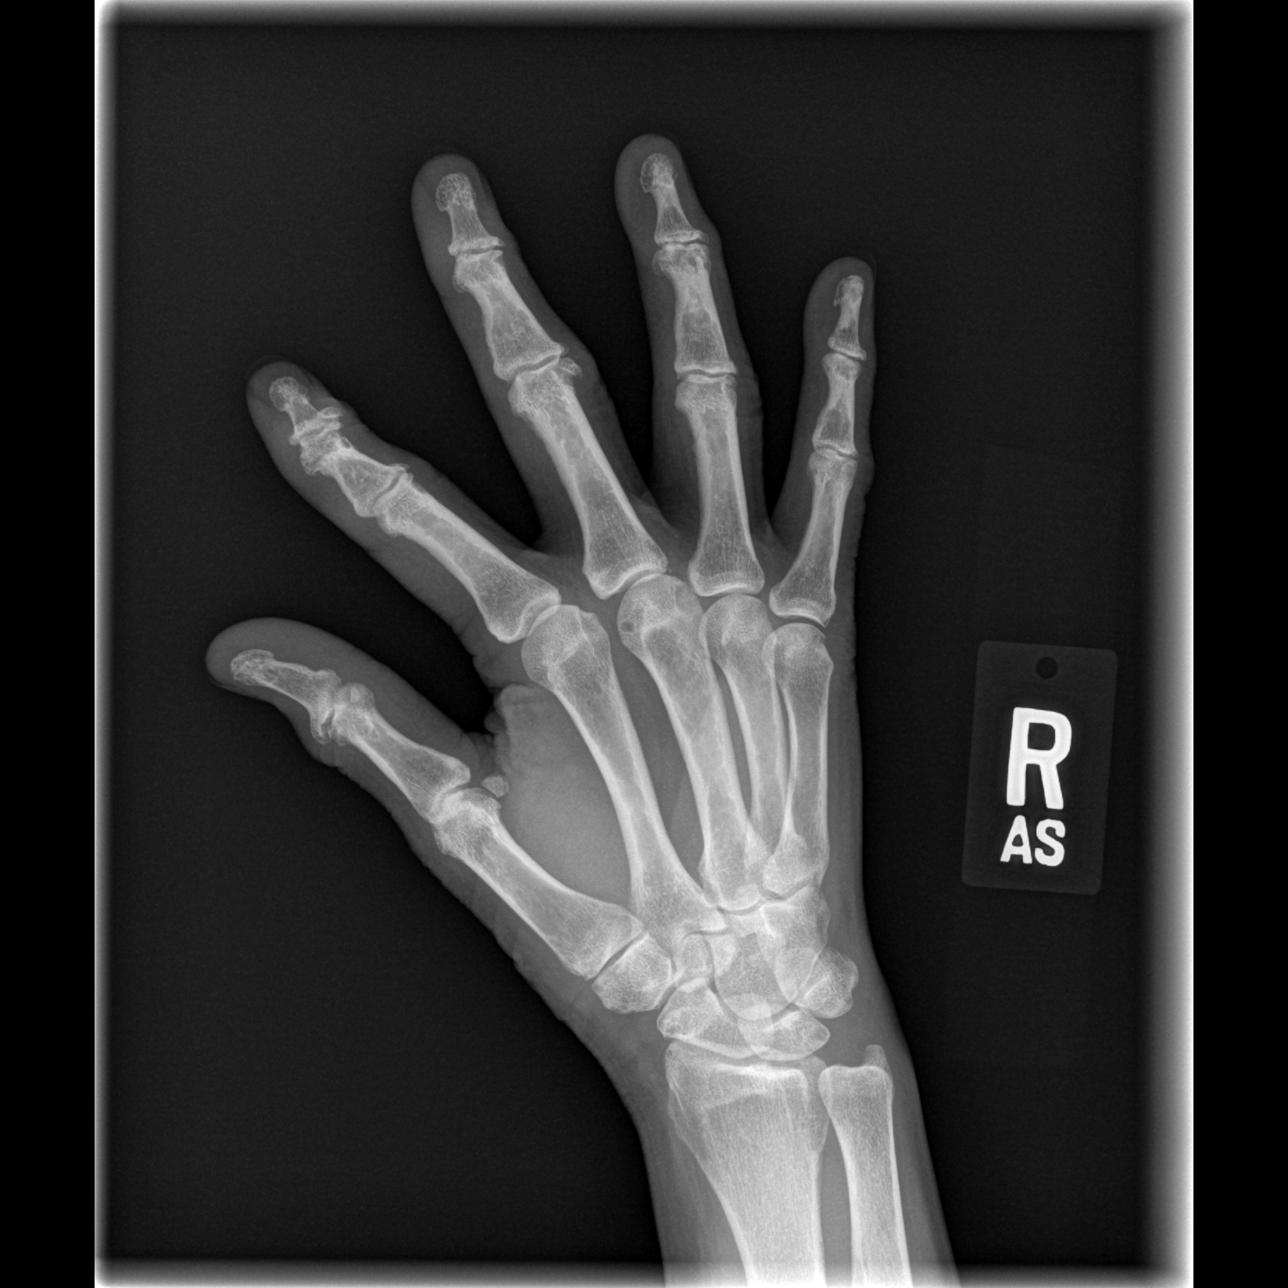

[x hand lat right]
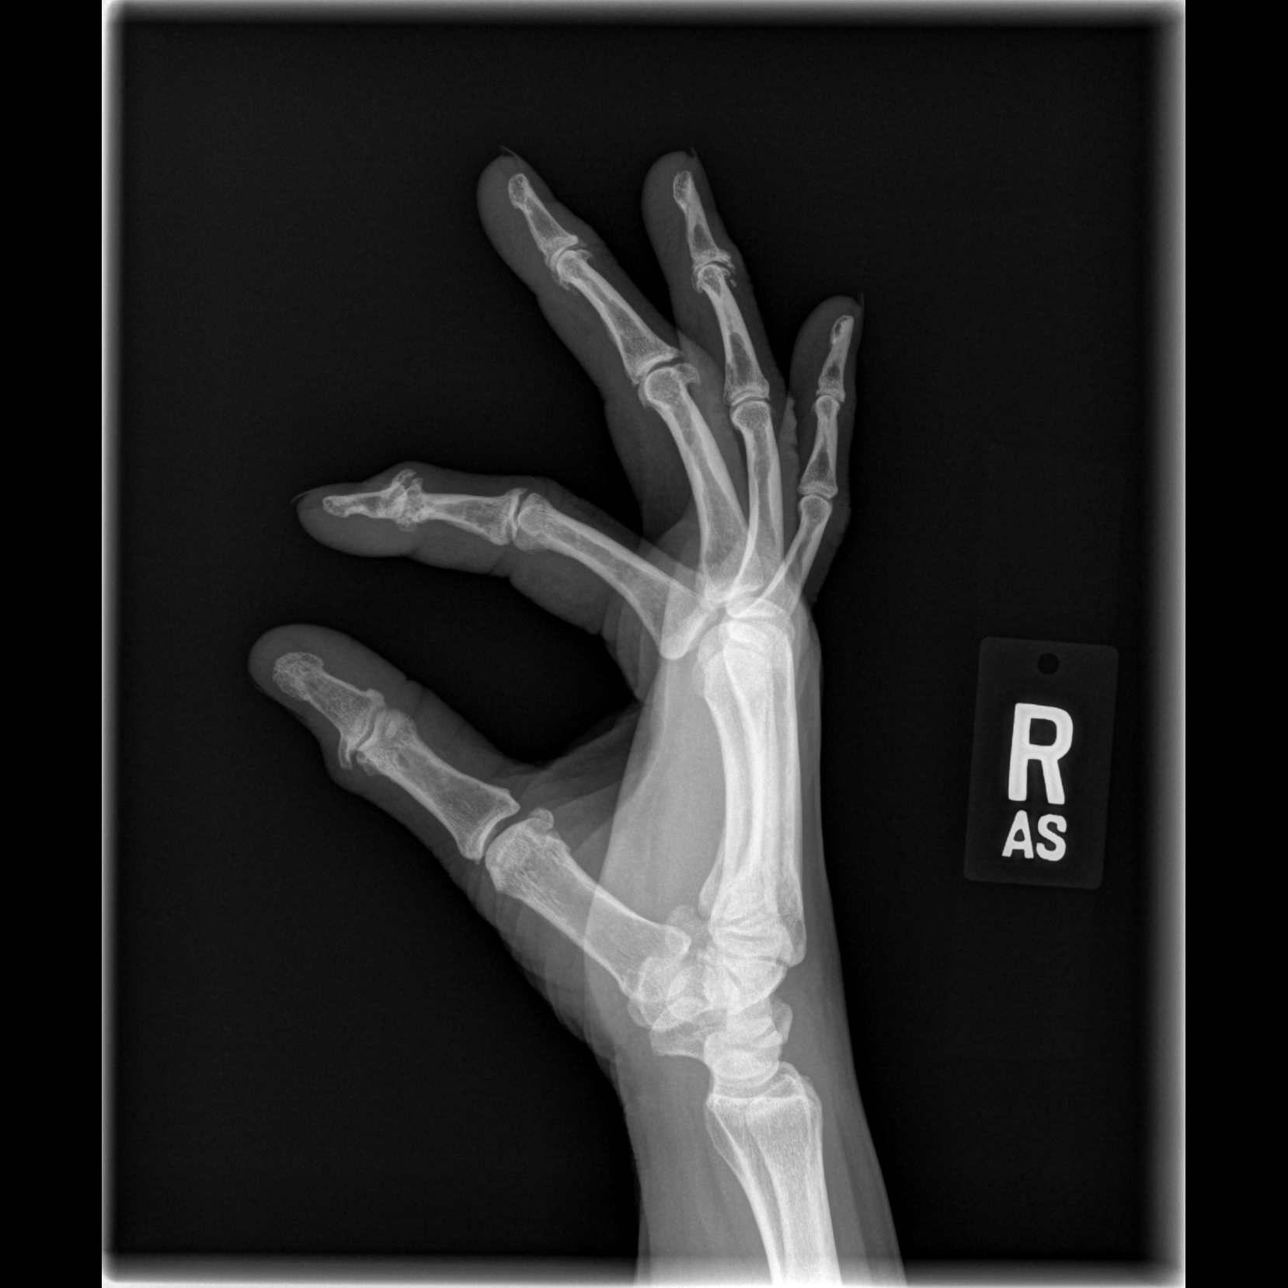

[3 of 3 positions shown; findings below may reference images not displayed]

FINDINGS: Mild-to-moderate degenerative changes in the 2nd through 5th DIP
joints and 1st IP joint.

Mild degenerative changes at the 3rd PIP joint.

No marginal erosions.

MCP joints and carpal bones are spared.
IMPRESSION: Mild to moderate degenerative changes predominantly involving the
DIP joints, suggesting osteoarthritis, as above.

## 2016-11-10 ENCOUNTER — Other Ambulatory Visit: Payer: Self-pay | Admitting: Obstetrics and Gynecology

## 2016-11-10 DIAGNOSIS — Z1231 Encounter for screening mammogram for malignant neoplasm of breast: Secondary | ICD-10-CM

## 2016-11-16 ENCOUNTER — Ambulatory Visit (INDEPENDENT_AMBULATORY_CARE_PROVIDER_SITE_OTHER): Payer: Medicare Other | Admitting: Orthopaedic Surgery

## 2016-11-16 DIAGNOSIS — M25512 Pain in left shoulder: Secondary | ICD-10-CM | POA: Diagnosis not present

## 2016-11-16 DIAGNOSIS — G8929 Other chronic pain: Secondary | ICD-10-CM

## 2016-11-16 DIAGNOSIS — M25511 Pain in right shoulder: Secondary | ICD-10-CM

## 2016-11-16 DIAGNOSIS — M25552 Pain in left hip: Secondary | ICD-10-CM | POA: Diagnosis not present

## 2016-11-16 NOTE — Progress Notes (Signed)
Office Visit Note   Patient: Darlene Jones           Date of Birth: [birth year]           MRN: [id number] Visit Date: 11/16/2016              Requested by: Wallene Huh, MD [address] [place], [place] [address] PCP: Patricia Nettle, MD   Assessment & Plan: Visit Diagnoses:  1. Pain in left hip   2. Chronic pain of both shoulders   Darlene Jones has evidence of bilateral shoulder osteoarthritis. Our x-ray unit was nonfunctional this morning so I'll have her come back in the next 4-6 weeks and obtain films of her hip. I suspect she has arthritis in both hips. Long discussion regarding her diagnosis and treatment options. We will go slowly as definitive treatment would be shoulder replacement. She might want to consider cortisone injection in one or both shoulders at some point in the near future. In the meantime she'll try therapy and continue with her ibuprofen.  Plan: Physical therapy for range of motion and strengthening both shoulders. Follow up in the next 4-6 weeks  Follow-Up Instructions: Return in about 1 month (around 12/17/2016).   Orders:  Orders Placed This Encounter  Procedures  . Ambulatory referral to Physical Therapy   No orders of the defined types were placed in this encounter.     Procedures: No procedures performed   Clinical Data: No additional findings.   Subjective: No chief complaint on file. Darlene Jones relates insidious onset of bilateral hip pain over the last "several years" she is experiencing more trouble on the left than the right side with pain along the lateral aspect of both hips. She's tried ibuprofen 800 mg twice a day that "helps. She is quite stiff in the morning and the medicine helps her "move around.  She also has experienced bilateral shoulder pain over the past year without injury or trauma. She denies any numbness or tingling. She's not experience any swelling of either upper extremity. She denies any neck pain. She did  have films of her left shoulder and hands performed recently through Dr. Tonita Phoenix office. I reviewed these on the PACS system. She has considerable osteoarthritis of the glenohumeral joint left shoulderwith inferior humeral head spurs  and irregularity of the glenohumeral joint surfaces. Dr. Montez Morita has performed several lab studies. Her rheumatoid factor was negative. Darlene Jones did have some problem with "muscles" and once she stopped the simvastatin that resolved. Her present problem seems to be unrelated to any "muscle issue" HPI  Review of Systems   Objective: Vital Signs: There were no vitals taken for this visit.  Physical Exam  Ortho Exam Mrs. Osinski was very pleasant. She is awake alert and oriented 3. Painless range of motion of cervical spine in flexion and extension. Mild limitation of neck extension but without referred pain. Cannot touch the middle of her back with either hand she has limited internal rotation. Also had approximately 20 of external rotation of both shoulders with discomfort. No crepitation. Simply 30 to full overhead motion with pain at that extreme. Positive impingement biceps appears to be intact. Neurovascular exam intact.  Some limitation of motion of both hips particularly with external rotation. Pain localized to groin lateral hip and thigh on extremes of motion bilaterally. Straight leg raise negative bilaterally. No swelling distally. Neurovascular exam intact.  Specialty Comments:  No specialty comments available.  Imaging: No results found.   PMFS History:  Patient Active Problem List   Diagnosis Date Noted  . Chronic idiopathic thrombocytopenia ([place]) 07/13/2012   No past medical history on file.  No family history on file.  No past surgical history on file. Social History   Occupational History  . Not on file.   Social History Main Topics  . Smoking status: Former Smoker    Packs/day: 0.50    Years: 40.00    Types: Cigarettes      Start date: 01/10/1960    Quit date: 01/10/2000  . Smokeless tobacco: Never Used     Comment: quit 14 years ago  . Alcohol use Not on file  . Drug use: Unknown  . Sexual activity: Not on file

## 2016-12-16 ENCOUNTER — Ambulatory Visit
Admission: RE | Admit: 2016-12-16 | Discharge: 2016-12-16 | Disposition: A | Discharge status: Home / Self Care | Payer: Medicare Other | Source: Ambulatory Visit | Attending: Obstetrics and Gynecology | Admitting: Obstetrics and Gynecology

## 2016-12-16 DIAGNOSIS — Z1231 Encounter for screening mammogram for malignant neoplasm of breast: Secondary | ICD-10-CM

## 2016-12-18 ENCOUNTER — Ambulatory Visit (INDEPENDENT_AMBULATORY_CARE_PROVIDER_SITE_OTHER): Payer: Medicare Other | Admitting: Orthopaedic Surgery

## 2016-12-18 ENCOUNTER — Encounter (INDEPENDENT_AMBULATORY_CARE_PROVIDER_SITE_OTHER): Payer: Self-pay | Admitting: Orthopaedic Surgery

## 2016-12-18 ENCOUNTER — Ambulatory Visit (INDEPENDENT_AMBULATORY_CARE_PROVIDER_SITE_OTHER): Payer: Medicare Other

## 2016-12-18 VITALS — BP 151/86 | HR 77 | Resp 14 | Ht 60.0 in | Wt 150.0 lb

## 2016-12-18 DIAGNOSIS — M25552 Pain in left hip: Secondary | ICD-10-CM

## 2016-12-18 NOTE — Progress Notes (Signed)
   Office Visit Note   Patient: Darlene Jones           Date of Birth: 06/28/1941           MRN: [id number] Visit Date: 12/18/2016              Requested by: Wallene Huh, MD [address] [place], [place] [address] PCP: Patricia Nettle, MD   Assessment & Plan: Visit Diagnoses:Bi Lateral shoulder and hip osteoarthritis  Plan: Continue with exercises a lot as outlined by physical therapy, over-the-counter medicines follow up as needed. Discussion regarding different treatment options including surgery for either shoulder or either hip presently happy with her present regimen and we'll see back as needed.   Follow-Up Instructions: No Follow-up on file.   Orders:  No orders of the defined types were placed in this encounter.  No orders of the defined types were placed in this encounter.     Procedures: No procedures performed   Clinical Data: No additional findings.   Subjective: No chief complaint on file.    bilateral hip pain over last several years  bilateral shoulder pain over the past year without injury or trauma. She denies any numbness or tingling. She's not experience any swelling of either upper extremity. She denies any neck pain. PT x 2 week and pt relates she feels it is going well.   We were unable to obtain films of her hips on her last office visit so we will obtain those today. She is having some trouble with both groin and anterior thighs with ambulation and  Review of Systems   Objective: Vital Signs: There were no vitals taken for this visit.  Physical Exam  Ortho Exam shoulder exams are essentially unchanged she does have some limitation of motion but it's not uncomfortable. She is happy with her present course in terms of her shoulder. Significant range of motion of both hips with internal and sternal rotation with pain on those extremes. There is only total motion of approximate 15-20 from neutral with combined internal and external  rotation. Her vascular intact distally. No knee pain.  Specialty Comments:  No specialty comments available.  Imaging: No results found.   PMFS History: Patient Active Problem List   Diagnosis Date Noted  . Chronic idiopathic thrombocytopenia ([place]) 07/13/2012   No past medical history on file.  No family history on file.  Past Surgical History:  Procedure Laterality Date  . BREAST EXCISIONAL BIOPSY Right    Social History   Occupational History  . Not on file.   Social History Main Topics  . Smoking status: Former Smoker    Packs/day: 0.50    Years: 40.00    Types: Cigarettes    Start date: 01/10/1960    Quit date: 01/10/2000  . Smokeless tobacco: Never Used     Comment: quit 14 years ago  . Alcohol use Not on file  . Drug use: Unknown  . Sexual activity: Not on file

## 2017-01-01 DIAGNOSIS — M25552 Pain in left hip: Secondary | ICD-10-CM | POA: Diagnosis not present

## 2017-01-01 DIAGNOSIS — M25512 Pain in left shoulder: Secondary | ICD-10-CM | POA: Diagnosis not present

## 2017-01-01 DIAGNOSIS — M25511 Pain in right shoulder: Secondary | ICD-10-CM | POA: Diagnosis not present

## 2017-01-01 DIAGNOSIS — M25551 Pain in right hip: Secondary | ICD-10-CM | POA: Diagnosis not present

## 2017-01-06 DIAGNOSIS — M25512 Pain in left shoulder: Secondary | ICD-10-CM | POA: Diagnosis not present

## 2017-01-06 DIAGNOSIS — M25551 Pain in right hip: Secondary | ICD-10-CM | POA: Diagnosis not present

## 2017-01-06 DIAGNOSIS — M25552 Pain in left hip: Secondary | ICD-10-CM | POA: Diagnosis not present

## 2017-01-06 DIAGNOSIS — M25511 Pain in right shoulder: Secondary | ICD-10-CM | POA: Diagnosis not present

## 2017-01-15 DIAGNOSIS — M25512 Pain in left shoulder: Secondary | ICD-10-CM | POA: Diagnosis not present

## 2017-01-15 DIAGNOSIS — M25552 Pain in left hip: Secondary | ICD-10-CM | POA: Diagnosis not present

## 2017-01-15 DIAGNOSIS — M25551 Pain in right hip: Secondary | ICD-10-CM | POA: Diagnosis not present

## 2017-01-15 DIAGNOSIS — M25511 Pain in right shoulder: Secondary | ICD-10-CM | POA: Diagnosis not present

## 2017-01-19 DIAGNOSIS — M25552 Pain in left hip: Secondary | ICD-10-CM | POA: Diagnosis not present

## 2017-01-19 DIAGNOSIS — M25511 Pain in right shoulder: Secondary | ICD-10-CM | POA: Diagnosis not present

## 2017-01-19 DIAGNOSIS — M25551 Pain in right hip: Secondary | ICD-10-CM | POA: Diagnosis not present

## 2017-01-19 DIAGNOSIS — M25512 Pain in left shoulder: Secondary | ICD-10-CM | POA: Diagnosis not present

## 2017-02-01 DIAGNOSIS — E785 Hyperlipidemia, unspecified: Secondary | ICD-10-CM | POA: Diagnosis not present

## 2017-02-01 DIAGNOSIS — M1990 Unspecified osteoarthritis, unspecified site: Secondary | ICD-10-CM | POA: Diagnosis not present

## 2017-02-01 DIAGNOSIS — I10 Essential (primary) hypertension: Secondary | ICD-10-CM | POA: Diagnosis not present

## 2017-02-01 DIAGNOSIS — E119 Type 2 diabetes mellitus without complications: Secondary | ICD-10-CM | POA: Diagnosis not present

## 2017-04-01 ENCOUNTER — Ambulatory Visit (INDEPENDENT_AMBULATORY_CARE_PROVIDER_SITE_OTHER): Payer: Self-pay

## 2017-04-01 ENCOUNTER — Ambulatory Visit (INDEPENDENT_AMBULATORY_CARE_PROVIDER_SITE_OTHER): Payer: Medicare Other | Admitting: Orthopaedic Surgery

## 2017-04-01 DIAGNOSIS — G8929 Other chronic pain: Secondary | ICD-10-CM

## 2017-04-01 DIAGNOSIS — M25511 Pain in right shoulder: Secondary | ICD-10-CM

## 2017-04-01 MED ORDER — METHYLPREDNISOLONE ACETATE 40 MG/ML IJ SUSP
80.0000 mg | INTRAMUSCULAR | Status: AC | PRN
  Administered 2017-04-01: 80 mg

## 2017-04-01 MED ORDER — BUPIVACAINE HCL 0.5 % IJ SOLN
2.0000 mL | INTRAMUSCULAR | Status: AC | PRN
  Administered 2017-04-01: 2 mL via INTRA_ARTICULAR

## 2017-04-01 MED ORDER — LIDOCAINE HCL 1 % IJ SOLN
2.0000 mL | INTRAMUSCULAR | Status: AC | PRN
  Administered 2017-04-01: 2 mL

## 2017-04-01 NOTE — Progress Notes (Signed)
Office Visit Note   Patient: Darlene Jones           Date of Birth: 07-18-1941           MRN: [id number] Visit Date: 04/01/2017              Requested by: Wallene Huh, MD [address] [place], [place] [address] PCP: Charolette Forward, MD   Assessment & Plan: Visit Diagnoses:  1. Chronic right shoulder pain   Primary osteoarthritis glenohumeral joint right shoulder  Plan: Long discussion regarding diagnosis and treatment options. Discussed shoulder replacement and cortisone injection. She like to proceed with a cortisone. She has had a prior course of physical therapy for both shoulders and has the exercises at home. We'll plan to see back as needed. After injection there was a considerable increase in range of motion. Abduction and 90 actively and approximately 140 of flexion actively. At least 45 of external rotation.  Follow-Up Instructions: Return if symptoms worsen or fail to improve.   Orders:  Orders Placed This Encounter  Procedures  . XR Shoulder Right   No orders of the defined types were placed in this encounter.     Procedures: Large Joint Inj Date/Time: 04/01/2017 4:05 PM Performed by: Garald Balding Authorized by: Garald Balding   Consent Given by:  Patient Timeout: prior to procedure the correct patient, procedure, and site was verified   Indications:  Pain Location:  Shoulder Site:  R glenohumeral Prep: patient was prepped and draped in usual sterile fashion   Needle Size:  25 G Needle Length:  1.5 inches Approach:  Posterior Ultrasound Guidance: No   Fluoroscopic Guidance: No   Arthrogram: No   Medications:  2 mL lidocaine 1 %; 2 mL bupivacaine 0.5 %; 80 mg methylPREDNISolone acetate 40 MG/ML Aspiration Attempted: No   Patient tolerance:  Patient tolerated the procedure well with no immediate complications     Clinical Data: No additional findings.   Subjective: Chief Complaint  Patient presents with  . Right Shoulder -  Pain    Darlene Jones is a 76 y o that presents with Right shoulder pain x months.  Darlene Jones has been seen for bilateral shoulder pain in the past. She's had films of the left demonstrating significant osteoarthritic changes. She has symptoms of the same on the right but no prior films. She's had a course of physical therapy it seemed to made a difference. Recently she's had an insidious onset of  right shoulder pain without injury or trauma. She denies numbness or tingling. Denies any neck pain. Pain is been associated with limited range of motion.  HPI  Review of Systems   Objective: Vital Signs: There were no vitals taken for this visit.  Physical Exam  Ortho Exam range of motion right shoulder is limited. 70 of abduction. Approximately 110 of passive flexion. 30 of external rotation 70 of internal rotation. Good grip and good release. Skin intact. No pain with range of motion of cervical spine.  Specialty Comments:  No specialty comments available.  Imaging: Xr Shoulder Right  Result Date: 04/01/2017 Films of the right shoulder obtained in 2 projections AP and lateral. There is advanced osteoarthritis of the glenohumeral joint. There are multiple subchondral cysts in both the glenoid and the humeral head. There is flattening of the humeral head. There are large osteophytes identified the inferior aspect of the humeral head. The humeral head appears to be centered in the glenoid. No ectopic calcification. Some  degenerative changes at the acromioclavicular joint. Films consistent with advanced osteoarthritis right shoulder    PMFS History: Patient Active Problem List   Diagnosis Date Noted  . Chronic idiopathic thrombocytopenia ([place]) 07/13/2012   No past medical history on file.  No family history on file.  Past Surgical History:  Procedure Laterality Date  . BREAST EXCISIONAL BIOPSY Right    Social History   Occupational History  . Not on file.   Social History  Main Topics  . Smoking status: Former Smoker    Packs/day: 0.50    Years: 40.00    Types: Cigarettes    Start date: 01/10/1960    Quit date: 01/10/2000  . Smokeless tobacco: Never Used     Comment: quit 14 years ago  . Alcohol use Not on file  . Drug use: Unknown  . Sexual activity: Not on file     Garald Balding, MD   Note - This record has been created using [employer].  Chart creation errors have been sought, but may not always  have been located. Such creation errors do not reflect on  the standard of medical care.

## 2017-04-20 DIAGNOSIS — E119 Type 2 diabetes mellitus without complications: Secondary | ICD-10-CM | POA: Diagnosis not present

## 2017-04-20 DIAGNOSIS — E785 Hyperlipidemia, unspecified: Secondary | ICD-10-CM | POA: Diagnosis not present

## 2017-04-20 DIAGNOSIS — I10 Essential (primary) hypertension: Secondary | ICD-10-CM | POA: Diagnosis not present

## 2017-04-26 DIAGNOSIS — H524 Presbyopia: Secondary | ICD-10-CM | POA: Diagnosis not present

## 2017-05-03 DIAGNOSIS — F419 Anxiety disorder, unspecified: Secondary | ICD-10-CM | POA: Diagnosis not present

## 2017-05-03 DIAGNOSIS — I10 Essential (primary) hypertension: Secondary | ICD-10-CM | POA: Diagnosis not present

## 2017-05-03 DIAGNOSIS — K219 Gastro-esophageal reflux disease without esophagitis: Secondary | ICD-10-CM | POA: Diagnosis not present

## 2017-05-03 DIAGNOSIS — E119 Type 2 diabetes mellitus without complications: Secondary | ICD-10-CM | POA: Diagnosis not present

## 2017-05-27 DIAGNOSIS — H26492 Other secondary cataract, left eye: Secondary | ICD-10-CM | POA: Diagnosis not present

## 2017-08-09 DIAGNOSIS — E119 Type 2 diabetes mellitus without complications: Secondary | ICD-10-CM | POA: Diagnosis not present

## 2017-08-09 DIAGNOSIS — K219 Gastro-esophageal reflux disease without esophagitis: Secondary | ICD-10-CM | POA: Diagnosis not present

## 2017-08-09 DIAGNOSIS — E785 Hyperlipidemia, unspecified: Secondary | ICD-10-CM | POA: Diagnosis not present

## 2017-08-09 DIAGNOSIS — I10 Essential (primary) hypertension: Secondary | ICD-10-CM | POA: Diagnosis not present

## 2017-08-25 DIAGNOSIS — Z23 Encounter for immunization: Secondary | ICD-10-CM | POA: Diagnosis not present

## 2017-11-08 DIAGNOSIS — E119 Type 2 diabetes mellitus without complications: Secondary | ICD-10-CM | POA: Diagnosis not present

## 2017-11-08 DIAGNOSIS — E785 Hyperlipidemia, unspecified: Secondary | ICD-10-CM | POA: Diagnosis not present

## 2017-11-08 DIAGNOSIS — M1990 Unspecified osteoarthritis, unspecified site: Secondary | ICD-10-CM | POA: Diagnosis not present

## 2017-11-08 DIAGNOSIS — I10 Essential (primary) hypertension: Secondary | ICD-10-CM | POA: Diagnosis not present

## 2017-12-06 ENCOUNTER — Other Ambulatory Visit: Payer: Self-pay | Admitting: Obstetrics and Gynecology

## 2017-12-06 DIAGNOSIS — Z1231 Encounter for screening mammogram for malignant neoplasm of breast: Secondary | ICD-10-CM

## 2017-12-24 ENCOUNTER — Ambulatory Visit
Admission: RE | Admit: 2017-12-24 | Discharge: 2017-12-24 | Disposition: A | Discharge status: Home / Self Care | Payer: Medicare Other | Source: Ambulatory Visit | Attending: Obstetrics and Gynecology | Admitting: Obstetrics and Gynecology

## 2017-12-24 DIAGNOSIS — Z1231 Encounter for screening mammogram for malignant neoplasm of breast: Secondary | ICD-10-CM | POA: Diagnosis not present

## 2018-01-10 DIAGNOSIS — Z01419 Encounter for gynecological examination (general) (routine) without abnormal findings: Secondary | ICD-10-CM | POA: Diagnosis not present

## 2018-01-25 DIAGNOSIS — R42 Dizziness and giddiness: Secondary | ICD-10-CM | POA: Diagnosis not present

## 2018-01-25 DIAGNOSIS — E119 Type 2 diabetes mellitus without complications: Secondary | ICD-10-CM | POA: Diagnosis not present

## 2018-01-25 DIAGNOSIS — I10 Essential (primary) hypertension: Secondary | ICD-10-CM | POA: Diagnosis not present

## 2018-01-25 DIAGNOSIS — F419 Anxiety disorder, unspecified: Secondary | ICD-10-CM | POA: Diagnosis not present

## 2018-02-07 DIAGNOSIS — I10 Essential (primary) hypertension: Secondary | ICD-10-CM | POA: Diagnosis not present

## 2018-02-07 DIAGNOSIS — R42 Dizziness and giddiness: Secondary | ICD-10-CM | POA: Diagnosis not present

## 2018-02-07 DIAGNOSIS — E119 Type 2 diabetes mellitus without complications: Secondary | ICD-10-CM | POA: Diagnosis not present

## 2018-02-07 DIAGNOSIS — E785 Hyperlipidemia, unspecified: Secondary | ICD-10-CM | POA: Diagnosis not present

## 2018-02-17 ENCOUNTER — Ambulatory Visit (INDEPENDENT_AMBULATORY_CARE_PROVIDER_SITE_OTHER): Payer: Self-pay

## 2018-02-17 ENCOUNTER — Ambulatory Visit (INDEPENDENT_AMBULATORY_CARE_PROVIDER_SITE_OTHER): Payer: Medicare Other | Admitting: Orthopaedic Surgery

## 2018-02-17 ENCOUNTER — Encounter (INDEPENDENT_AMBULATORY_CARE_PROVIDER_SITE_OTHER): Payer: Self-pay | Admitting: Orthopaedic Surgery

## 2018-02-17 VITALS — BP 108/65 | HR 82 | Resp 16 | Ht 59.0 in | Wt 136.0 lb

## 2018-02-17 DIAGNOSIS — M25551 Pain in right hip: Secondary | ICD-10-CM | POA: Diagnosis not present

## 2018-02-17 DIAGNOSIS — M25552 Pain in left hip: Secondary | ICD-10-CM | POA: Diagnosis not present

## 2018-02-17 NOTE — Progress Notes (Signed)
Office Visit Note   Patient: Darlene Jones           Date of Birth: [birth year]           MRN: [id number] Visit Date: 02/17/2018              Requested by: Darlene Forward, MD [id number] [place], [place] [address] PCP: Darlene Forward, MD   Assessment & Plan: Visit Diagnoses:  1. Pain of both hip joints     Plan: End-stage osteoarthritis both hips.  Long discussion regarding the findings.  Definitive treatment would be total hip replacement.  She preferred to try more conservative course.  More symptomatic on the right.  We will ask Darlene Jones to perform an intra-articular cortisone injection.  If successful he might want to consider injecting the left hip at a later date.  Hip films today demonstrate little if any joint space remaining with subchondral cysts and sclerosis.  Follow-Up Instructions: No follow-ups on file.   Orders:  Orders Placed This Encounter  Procedures  . XR HIPS BILAT W OR W/O PELVIS 3-4 VIEWS  . Ambulatory referral to Physical Medicine Rehab   No orders of the defined types were placed in this encounter.     Procedures: No procedures performed   Clinical Data: No additional findings.   Subjective: Chief Complaint  Patient presents with  . Left Hip - Pain  . Right Hip - Pain  . New Patient (Initial Visit)    BIL LAT HIP PAIN FOR 1 YEAR NO INJURY  Darlene Jones relates a greater than one year history of bilateral hip pain.  She is been somewhat hesitant to consider treatment but realizes that at this point she has had significant compromise of her ability to ambulate and to perform activities of daily living.  She has a considerable limp.  She is experiencing pain in both groins, along the lateral aspect of both hips and even into both thighs.  She denies any back pain.  She is diabetic on Metformin.  Has been taking ibuprofen 800 mg.  Occasionally will use a cane or a walker.  Also.  Also feels on occasion she feels a sense of  unbalance  HPI  Review of Systems  Constitutional: Negative for fatigue and fever.  HENT: Negative for ear pain.   Eyes: Negative for pain.  Respiratory: Negative for cough and shortness of breath.   Cardiovascular: Negative for leg swelling.  Gastrointestinal: Negative for constipation and diarrhea.  Genitourinary: Negative for difficulty urinating.  Musculoskeletal: Positive for back pain. Negative for neck pain.  Skin: Negative for rash.  Allergic/Immunologic: Negative for food allergies.  Neurological: Negative for weakness and numbness.  Hematological: Does not bruise/bleed easily.  Psychiatric/Behavioral: Negative for sleep disturbance.     Objective: Vital Signs: BP 108/65 (BP Location: Left Arm, Patient Position: Sitting, Cuff Size: Normal)   Pulse 82   Resp 16   Ht 4\' 11"  (1.499 m)   Wt 136 lb (61.7 kg)   BMI 27.47 kg/m   Physical Exam  Constitutional: She is oriented to person, place, and time. She appears well-developed and well-nourished.  HENT:  Head: Normocephalic and atraumatic.  Eyes: Pupils are equal, round, and reactive to light. EOM are normal.  Pulmonary/Chest: Effort normal.  Neurological: She is alert and oriented to person, place, and time.  Skin: Skin is warm and dry.  Psychiatric: She has a normal mood and affect. Her behavior is normal. Judgment and thought content  normal.    Ortho Exam awake alert and oriented x3.  Comfortable sitting.  Has a distinct duck waddling gait consistent with bilateral hip pain.  Has very minimal internal/external rotation of both hips with pain beyond about 10 degrees of internal/external rotation.  Also has some lack of hip flexion extension bilaterally.  Neurovascular exam intact.  Straight leg raise negative bilaterally.  No tenderness around either hip.  No distal edema.  Specialty Comments:  No specialty comments available.  Imaging: Xr Hips Bilat W Or W/o Pelvis 3-4 Views  Result Date: 02/17/2018 AP pelvis  and lateral of both hips were obtained.  There is complete iteration of both hip joints consistent with end-stage osteoarthritis.  There is subchondral sclerosis, subchondral cysts on both sides of the joint tand possibly some superior collapse in both femoral heads.    PMFS History: Patient Active Problem List   Diagnosis Date Noted  . Chronic idiopathic thrombocytopenia ([place]) 07/13/2012   History reviewed. No pertinent past medical history.  History reviewed. No pertinent family history.  Past Surgical History:  Procedure Laterality Date  . BREAST EXCISIONAL BIOPSY Right 1992   benign   Social History   Occupational History  . Not on file  Tobacco Use  . Smoking status: Former Smoker    Packs/day: 0.50    Years: 40.00    Pack years: 20.00    Types: Cigarettes    Start date: 01/10/1960    Last attempt to quit: 01/10/2000    Years since quitting: 18.1  . Smokeless tobacco: Never Used  . Tobacco comment: quit 14 years ago  Substance and Sexual Activity  . Alcohol use: Not Currently    Alcohol/week: 0.0 oz  . Drug use: Not Currently  . Sexual activity: Not on file

## 2018-02-21 ENCOUNTER — Ambulatory Visit (INDEPENDENT_AMBULATORY_CARE_PROVIDER_SITE_OTHER): Payer: Medicare Other | Admitting: Orthopaedic Surgery

## 2018-03-04 ENCOUNTER — Ambulatory Visit (INDEPENDENT_AMBULATORY_CARE_PROVIDER_SITE_OTHER): Payer: Medicare Other

## 2018-03-04 ENCOUNTER — Encounter (INDEPENDENT_AMBULATORY_CARE_PROVIDER_SITE_OTHER): Payer: Self-pay | Admitting: Physical Medicine and Rehabilitation

## 2018-03-04 ENCOUNTER — Ambulatory Visit (INDEPENDENT_AMBULATORY_CARE_PROVIDER_SITE_OTHER): Payer: Medicare Other | Admitting: Physical Medicine and Rehabilitation

## 2018-03-04 DIAGNOSIS — M25551 Pain in right hip: Secondary | ICD-10-CM | POA: Diagnosis not present

## 2018-03-04 MED ORDER — BUPIVACAINE HCL 0.5 % IJ SOLN
3.0000 mL | INTRAMUSCULAR | Status: AC | PRN
  Administered 2018-03-04: 3 mL via INTRA_ARTICULAR

## 2018-03-04 MED ORDER — TRIAMCINOLONE ACETONIDE 40 MG/ML IJ SUSP
80.0000 mg | INTRAMUSCULAR | Status: AC | PRN
  Administered 2018-03-04: 80 mg via INTRA_ARTICULAR

## 2018-03-04 NOTE — Progress Notes (Signed)
Darlene Jones - 77 y.o. female MRN [id number]  Date of birth: Feb 24, 1941  Office Visit Note: Visit Date: 03/04/2018 PCP: Charolette Forward, MD Referred by: Charolette Forward, MD  Subjective: Chief Complaint  Patient presents with  . Right Hip - Pain   HPI: Darlene Jones is a 77 year old female with known severe hip osteoarthritis in the bilateral hips.  She comes in today at the request of Dr. Durward Fortes for anesthetic hip arthrogram on the right.  She reports right hip pain which is bilateral anterior and posterior but not so much in the groin.  She does report decreased range of motion and she does use a walker for ambulation.  She said this really started about a year ago and really does limit what she can do.  Depending on relief we will schedule her for left-sided injection in 2 weeks.   ROS Otherwise per HPI.  Assessment & Plan: Visit Diagnoses:  1. Pain in right hip     Plan: No additional findings.   Meds & Orders: No orders of the defined types were placed in this encounter.   Orders Placed This Encounter  Procedures  . Large Joint Inj: R hip joint  . XR C-ARM NO REPORT    Follow-up: Return if symptoms worsen or fail to improve, for Possible Left hip injection in two weeks.   Procedures: Large Joint Inj: R hip joint on 03/04/2018 10:07 AM Indications: pain and diagnostic evaluation Details: 22 G needle, anterior approach  Arthrogram: Yes  Medications: 80 mg triamcinolone acetonide 40 MG/ML; 3 mL bupivacaine 0.5 % Outcome: tolerated well, no immediate complications  Arthrogram demonstrated excellent flow of contrast throughout the joint surface without extravasation or obvious defect.  The patient had relief of symptoms during the anesthetic phase of the injection.  Procedure, treatment alternatives, risks and benefits explained, specific risks discussed. Consent was given by the patient. Immediately prior to procedure a time out was called to verify the correct patient,  procedure, equipment, support staff and site/side marked as required. Patient was prepped and draped in the usual sterile fashion.      No notes on file   Clinical History: No specialty comments available.   She reports that she quit smoking about 18 years ago. Her smoking use included cigarettes. She started smoking about 58 years ago. She has a 20.00 pack-year smoking history. She has never used smokeless tobacco. No results for input(s): HGBA1C, LABURIC in the last 8760 hours.  Objective:  VS:  HT:    WT:   BMI:     BP:   HR: bpm  TEMP: ( )  RESP:  Physical Exam  Ortho Exam Imaging: Xr C-arm No Report  Result Date: 03/04/2018 Please see Notes or Procedures tab for imaging impression.   Past Medical/Family/Surgical/Social History: Medications & Allergies reviewed per EMR, new medications updated. Patient Active Problem List   Diagnosis Date Noted  . Chronic idiopathic thrombocytopenia ([place]) 07/13/2012   History reviewed. No pertinent past medical history. History reviewed. No pertinent family history. Past Surgical History:  Procedure Laterality Date  . BREAST EXCISIONAL BIOPSY Right 1992   benign   Social History   Occupational History  . Not on file  Tobacco Use  . Smoking status: Former Smoker    Packs/day: 0.50    Years: 40.00    Pack years: 20.00    Types: Cigarettes    Start date: 01/10/1960    Last attempt to quit: 01/10/2000    Years since quitting:  18.1  . Smokeless tobacco: Never Used  . Tobacco comment: quit 14 years ago  Substance and Sexual Activity  . Alcohol use: Not Currently    Alcohol/week: 0.0 oz  . Drug use: Not Currently  . Sexual activity: Not on file

## 2018-03-04 NOTE — Progress Notes (Signed)
.  Numeric Pain Rating Scale and Functional Assessment Average Pain 7   In the last MONTH (on 0-10 scale) has pain interfered with the following?  1. General activity like being  able to carry out your everyday physical activities such as walking, climbing stairs, carrying groceries, or moving a chair?  Rating(4)   -Dye Allergies.  

## 2018-03-04 NOTE — Patient Instructions (Signed)

## 2018-03-21 ENCOUNTER — Ambulatory Visit (INDEPENDENT_AMBULATORY_CARE_PROVIDER_SITE_OTHER): Payer: Medicare Other | Admitting: Physical Medicine and Rehabilitation

## 2018-03-21 ENCOUNTER — Encounter (INDEPENDENT_AMBULATORY_CARE_PROVIDER_SITE_OTHER): Payer: Self-pay | Admitting: Physical Medicine and Rehabilitation

## 2018-03-21 ENCOUNTER — Ambulatory Visit (INDEPENDENT_AMBULATORY_CARE_PROVIDER_SITE_OTHER): Payer: Self-pay

## 2018-03-21 DIAGNOSIS — M25552 Pain in left hip: Secondary | ICD-10-CM | POA: Diagnosis not present

## 2018-03-21 NOTE — Progress Notes (Signed)
Darlene Jones - 77 y.o. female MRN [id number]  Date of birth: 09/29/1941  Office Visit Note: Visit Date: 03/21/2018 PCP: Charolette Forward, MD Referred by: Charolette Forward, MD  Subjective: Chief Complaint  Patient presents with  . Left Hip - Pain   HPI: Darlene Jones is a 77 year old female that we recently completed right diagnostic and therapeutic intra-articular anesthetic hip arthrogram.  Patient got good relief of her pain on the right side and is doing fairly well.  She comes in today for planned left-sided diagnostic and hopefully therapeutic injection.  She asked the question why her gait and walking is not improved.  She has severe bilateral degenerative joint disease of the hips.  She ambulates with a walker but without the walker she has a pretty significant Trendelenburg gait bilaterally.  This is likely mechanical related to severe arthritis noted.  She will discuss this with Dr. Joni Fears.   ROS Otherwise per HPI.  Assessment & Plan: Visit Diagnoses:  1. Left hip pain     Plan: Findings:  Diagnostic and with therapeutic anesthetic hip arthrogram on the left.  Patient did have relief during the anesthetic portion of the injection.  Patient clearly would do well with bilateral hip replacements.  Her gait problems have been chronic because of the chronic severe arthritis.  She probably would need extensive physical therapy.  Without the hip replacements I do not think her gait is going to get any better.    Meds & Orders: No orders of the defined types were placed in this encounter.   Orders Placed This Encounter  Procedures  . Large Joint Inj: L hip joint  . XR C-ARM NO REPORT    Follow-up: Return if symptoms worsen or fail to improve, for Dr. Durward Fortes.   Procedures: Large Joint Inj: L hip joint on 03/21/2018 3:34 PM Indications: pain and diagnostic evaluation Details: 22 G needle, anterior approach  Arthrogram: Yes  Medications: 80 mg triamcinolone acetonide  40 MG/ML; 3 mL bupivacaine 0.5 % Outcome: tolerated well, no immediate complications  Arthrogram demonstrated excellent flow of contrast throughout the joint surface without extravasation or obvious defect.  The patient had relief of symptoms during the anesthetic phase of the injection.  Procedure, treatment alternatives, risks and benefits explained, specific risks discussed. Consent was given by the patient. Immediately prior to procedure a time out was called to verify the correct patient, procedure, equipment, support staff and site/side marked as required. Patient was prepped and draped in the usual sterile fashion.      No notes on file   Clinical History: No specialty comments available.   She reports that she quit smoking about 18 years ago. Her smoking use included cigarettes. She started smoking about 58 years ago. She has a 20.00 pack-year smoking history. She has never used smokeless tobacco. No results for input(s): HGBA1C, LABURIC in the last 8760 hours.  Objective:  VS:  HT:    WT:   BMI:     BP:   HR: bpm  TEMP: ( )  RESP:  Physical Exam  Ortho Exam Imaging: No results found.  Past Medical/Family/Surgical/Social History: Medications & Allergies reviewed per EMR, new medications updated. Patient Active Problem List   Diagnosis Date Noted  . Chronic idiopathic thrombocytopenia ([place]) 07/13/2012   History reviewed. No pertinent past medical history. History reviewed. No pertinent family history. Past Surgical History:  Procedure Laterality Date  . BREAST EXCISIONAL BIOPSY Right 1992   benign   Social History  Occupational History  . Not on file  Tobacco Use  . Smoking status: Former Smoker    Packs/day: 0.50    Years: 40.00    Pack years: 20.00    Types: Cigarettes    Start date: 01/10/1960    Last attempt to quit: 01/10/2000    Years since quitting: 18.2  . Smokeless tobacco: Never Used  . Tobacco comment: quit 14 years ago  Substance and Sexual  Activity  . Alcohol use: Not Currently    Alcohol/week: 0.0 oz  . Drug use: Not Currently  . Sexual activity: Not on file

## 2018-03-21 NOTE — Progress Notes (Signed)
.  Numeric Pain Rating Scale and Functional Assessment Average Pain 6   In the last MONTH (on 0-10 scale) has pain interfered with the following?  1. General activity like being  able to carry out your everyday physical activities such as walking, climbing stairs, carrying groceries, or moving a chair?  Rating(2)    

## 2018-03-21 NOTE — Patient Instructions (Signed)

## 2018-03-23 MED ORDER — BUPIVACAINE HCL 0.5 % IJ SOLN
3.0000 mL | INTRAMUSCULAR | Status: AC | PRN
Start: 2018-03-21 — End: 2018-03-21
  Administered 2018-03-21: 3 mL via INTRA_ARTICULAR

## 2018-03-23 MED ORDER — TRIAMCINOLONE ACETONIDE 40 MG/ML IJ SUSP
80.0000 mg | INTRAMUSCULAR | Status: AC | PRN
  Administered 2018-03-21: 80 mg via INTRA_ARTICULAR

## 2018-05-03 DIAGNOSIS — I10 Essential (primary) hypertension: Secondary | ICD-10-CM | POA: Diagnosis not present

## 2018-05-03 DIAGNOSIS — E119 Type 2 diabetes mellitus without complications: Secondary | ICD-10-CM | POA: Diagnosis not present

## 2018-05-03 DIAGNOSIS — E785 Hyperlipidemia, unspecified: Secondary | ICD-10-CM | POA: Diagnosis not present

## 2018-05-09 DIAGNOSIS — I10 Essential (primary) hypertension: Secondary | ICD-10-CM | POA: Diagnosis not present

## 2018-05-09 DIAGNOSIS — E785 Hyperlipidemia, unspecified: Secondary | ICD-10-CM | POA: Diagnosis not present

## 2018-05-09 DIAGNOSIS — E119 Type 2 diabetes mellitus without complications: Secondary | ICD-10-CM | POA: Diagnosis not present

## 2018-05-09 DIAGNOSIS — F419 Anxiety disorder, unspecified: Secondary | ICD-10-CM | POA: Diagnosis not present

## 2018-07-22 DIAGNOSIS — H524 Presbyopia: Secondary | ICD-10-CM | POA: Diagnosis not present

## 2018-08-08 DIAGNOSIS — I10 Essential (primary) hypertension: Secondary | ICD-10-CM | POA: Diagnosis not present

## 2018-08-08 DIAGNOSIS — E119 Type 2 diabetes mellitus without complications: Secondary | ICD-10-CM | POA: Diagnosis not present

## 2018-08-08 DIAGNOSIS — F419 Anxiety disorder, unspecified: Secondary | ICD-10-CM | POA: Diagnosis not present

## 2018-08-08 DIAGNOSIS — E785 Hyperlipidemia, unspecified: Secondary | ICD-10-CM | POA: Diagnosis not present

## 2018-08-23 ENCOUNTER — Other Ambulatory Visit: Payer: Self-pay | Admitting: Gastroenterology

## 2018-08-23 DIAGNOSIS — R634 Abnormal weight loss: Secondary | ICD-10-CM | POA: Diagnosis not present

## 2018-08-23 DIAGNOSIS — R194 Change in bowel habit: Secondary | ICD-10-CM | POA: Diagnosis not present

## 2018-08-23 DIAGNOSIS — Z1211 Encounter for screening for malignant neoplasm of colon: Secondary | ICD-10-CM | POA: Diagnosis not present

## 2018-08-29 ENCOUNTER — Ambulatory Visit
Admission: RE | Admit: 2018-08-29 | Discharge: 2018-08-29 | Disposition: A | Discharge status: Home / Self Care | Payer: Medicare Other | Source: Ambulatory Visit | Attending: Gastroenterology | Admitting: Gastroenterology

## 2018-08-29 DIAGNOSIS — R634 Abnormal weight loss: Secondary | ICD-10-CM

## 2018-08-29 DIAGNOSIS — R1900 Intra-abdominal and pelvic swelling, mass and lump, unspecified site: Secondary | ICD-10-CM | POA: Diagnosis not present

## 2018-08-29 IMAGING — CT CT ABD-PELV W/ CM
1 of 3 series · 13 of 32 positions shown, 19 images · IV contrast (iopamidol)
Comparison: 06/11/2006

CLINICAL DATA: Abnormal weight loss of 20 lb in past 3 months. Non
localized abdominal pain. Nausea and diarrhea.

EXAM:
CT ABDOMEN AND PELVIS WITH CONTRAST
TECHNIQUE: Multidetector CT imaging of the abdomen and pelvis was performed
using the standard protocol following bolus administration of
intravenous contrast.
CONTRAST:  100mL YHV9FV-CNN IOPAMIDOL (YHV9FV-CNN) INJECTION 61%

[Series 2: abd/pelvis w/cm · axial · 0.72mm/px · z∈[-352,+13]mm · 13 of 85 slices shown, 19 images]
[im 6/85  soft-tissue]
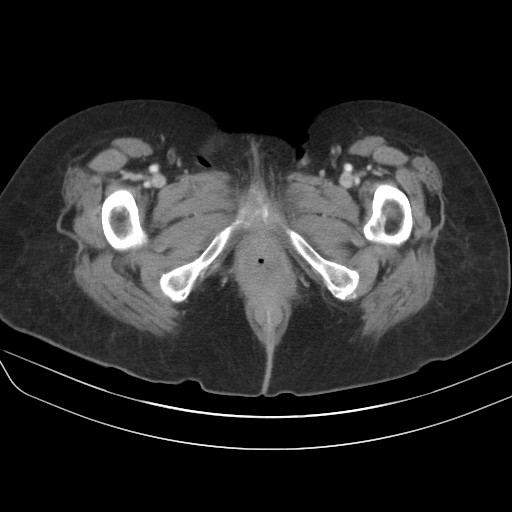
[im 6/85  bone]
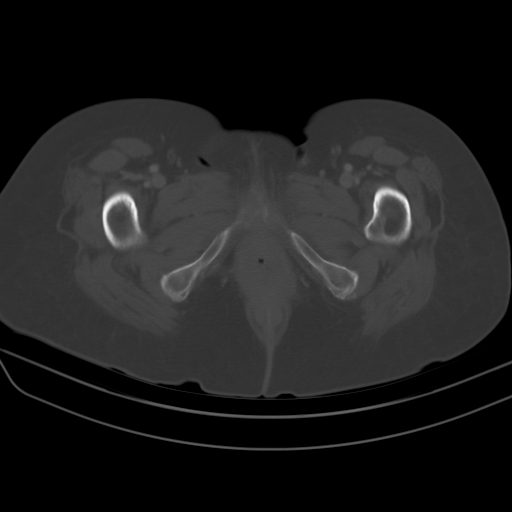
[im 12/85  soft-tissue]
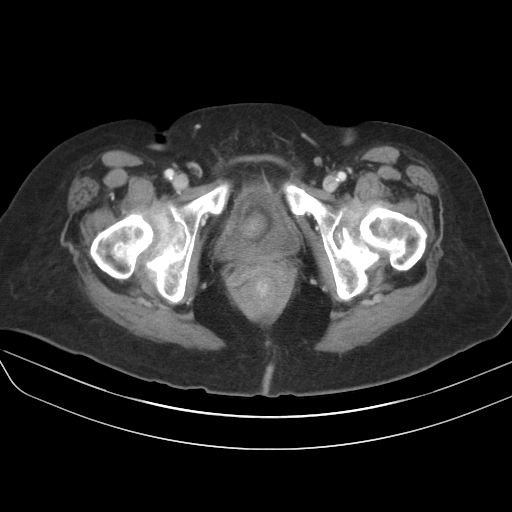
[im 17/85  soft-tissue]
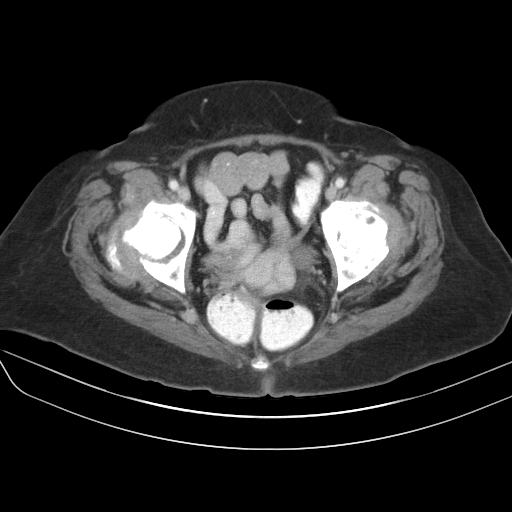
[im 23/85  soft-tissue]
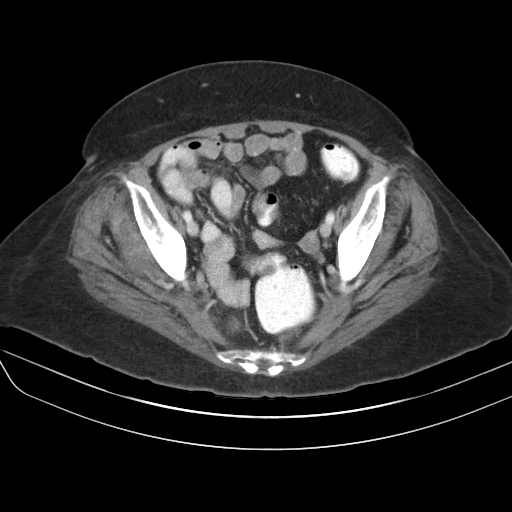
[im 29/85  soft-tissue]
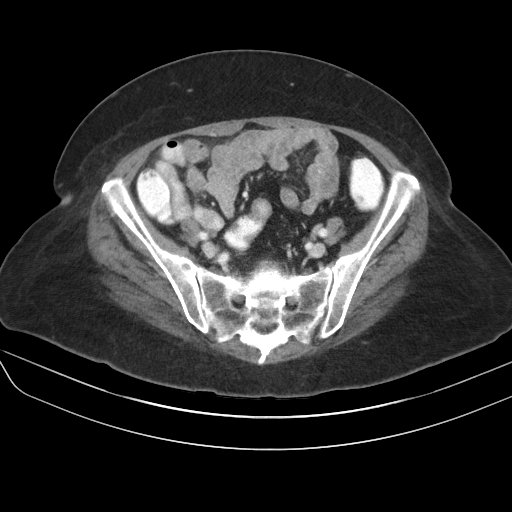
[im 34/85  soft-tissue]
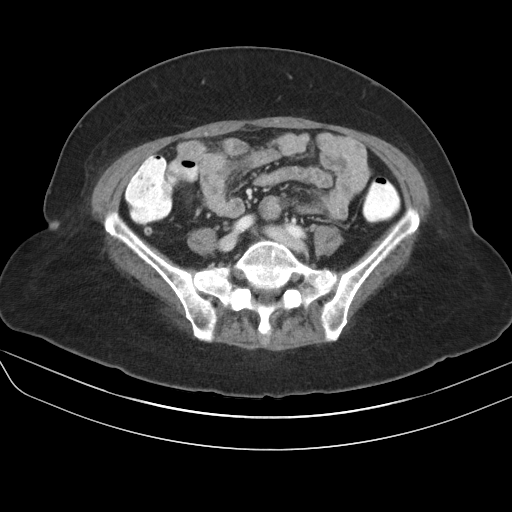
[im 45/85  soft-tissue]
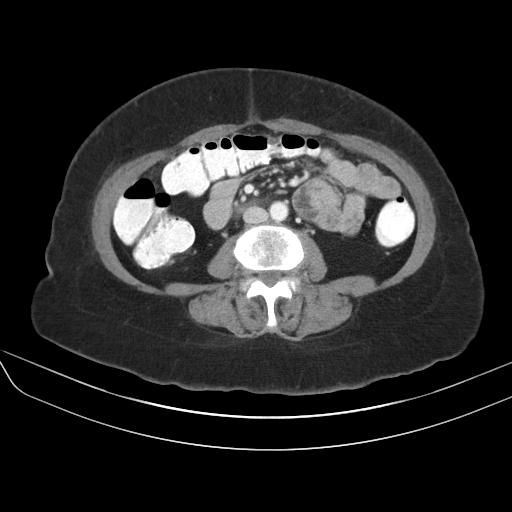
[im 51/85  soft-tissue]
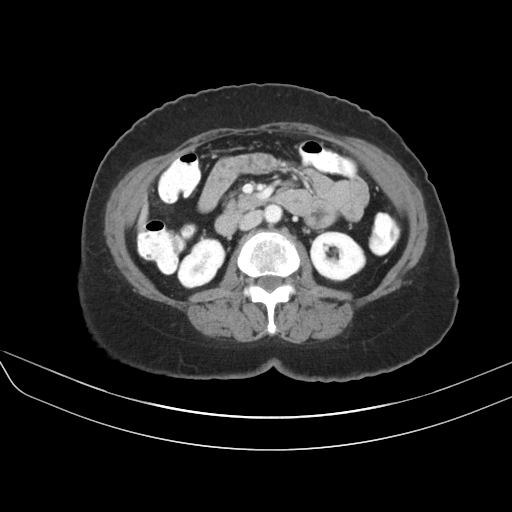
[im 57/85  soft-tissue]
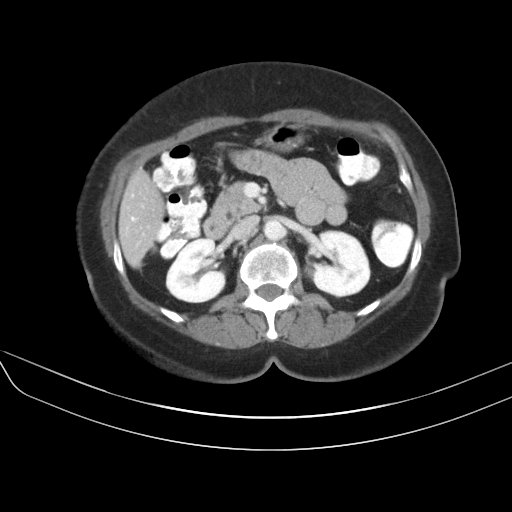
[im 57/85  bone]
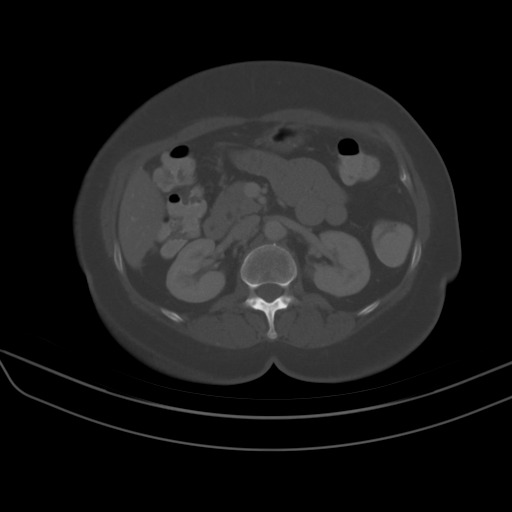
[im 62/85  soft-tissue]
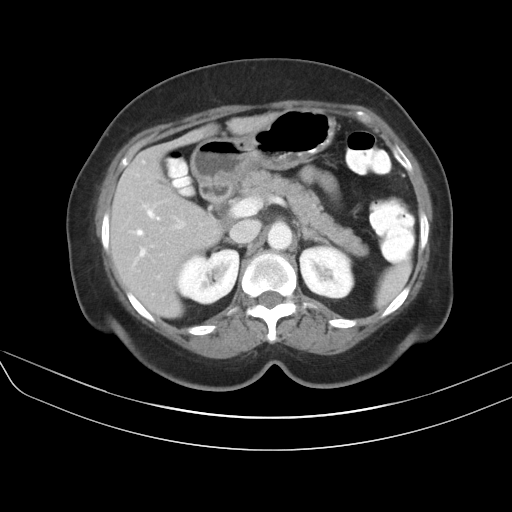
[im 62/85  lung]
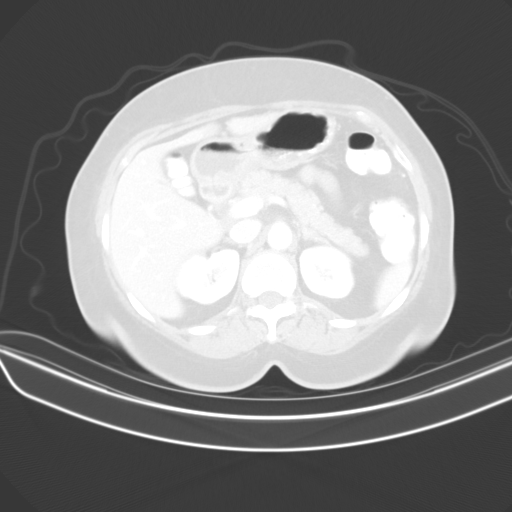
[im 68/85  soft-tissue]
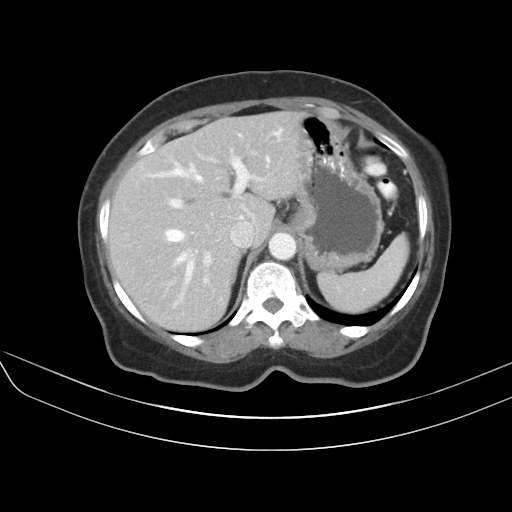
[im 68/85  lung]
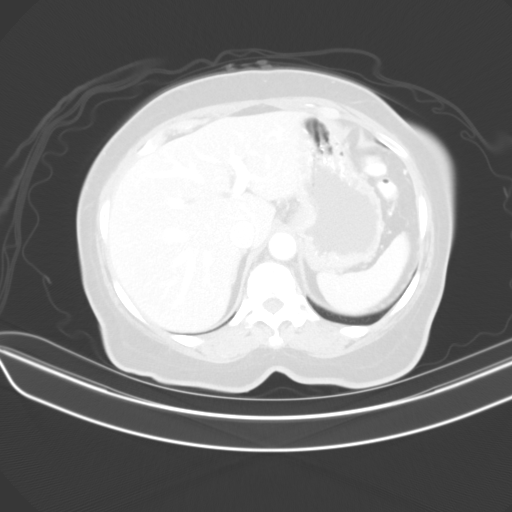
[im 73/85  soft-tissue]
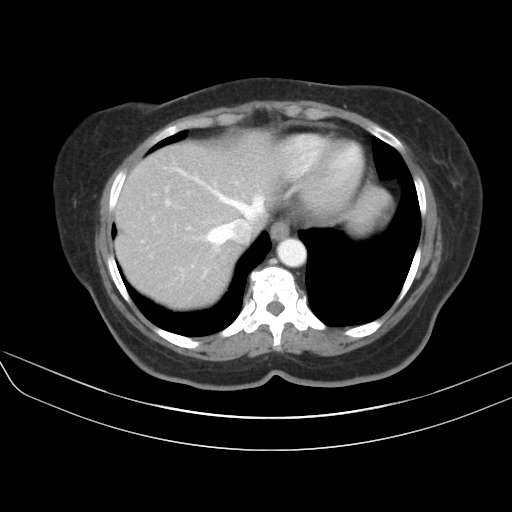
[im 73/85  lung]
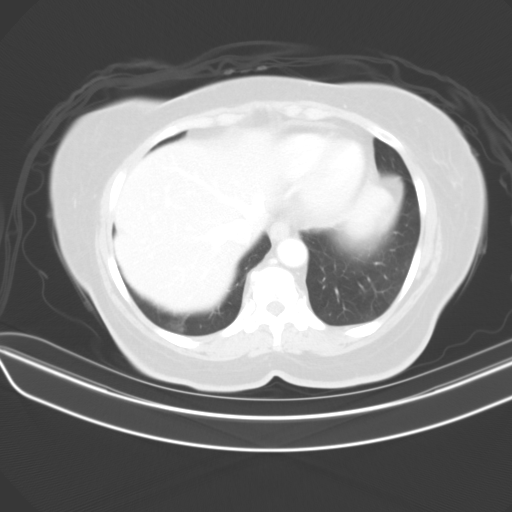
[im 79/85  soft-tissue]
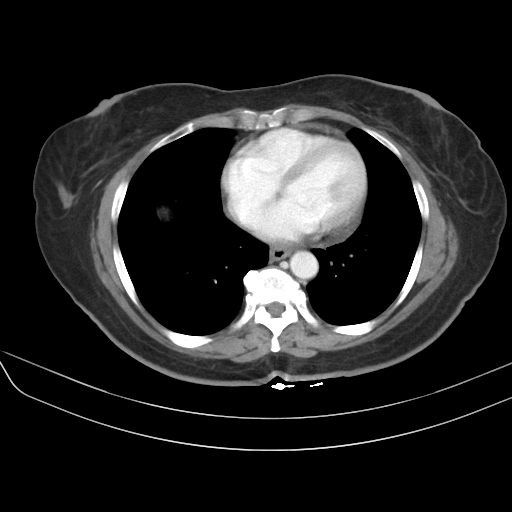
[im 79/85  lung]
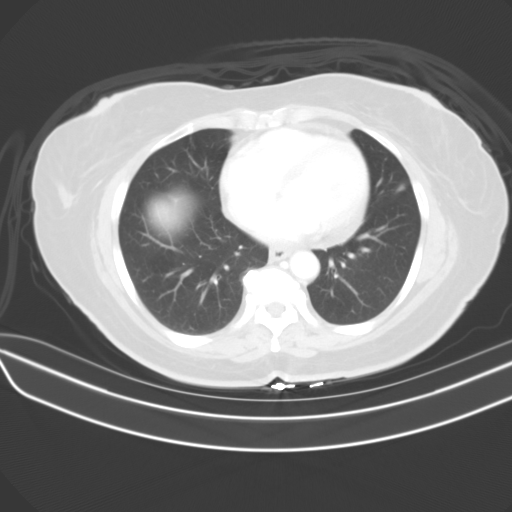

[13 of 32 positions shown; findings below may reference images not displayed]

FINDINGS: Lower Chest: No acute findings.

Hepatobiliary: No hepatic masses identified. Gallbladder is nearly
completely empty. No evidence of biliary ductal dilatation.

Pancreas:  No mass or inflammatory changes.

Spleen: Within normal limits in size and appearance.

Adrenals/Urinary Tract: No masses identified. No evidence of
hydronephrosis.

Stomach/Bowel: No evidence of obstruction, inflammatory process or
abnormal fluid collections. Normal appendix visualized.

Vascular/Lymphatic: No pathologically enlarged lymph nodes. No
abdominal aortic aneurysm.

Reproductive: Previous hysterectomy. A masses seen in the left
adnexa measuring 3.1 x 2.7 cm which has higher than fluid
attenuation. This could represent a complex cyst or enhancing mass.
No other pelvic mass or free fluid identified.

Other:  No evidence peritoneal thickening or ascites.

Musculoskeletal: No suspicious bone lesions identified. Severe
bilateral hip arthritis.
IMPRESSION: 3.1 cm left adnexal mass, which could represent a complex cyst or
solid mass. Recommend transvaginal pelvic ultrasound for further
evaluation.

## 2018-08-29 MED ORDER — IOPAMIDOL (ISOVUE-300) INJECTION 61%
100.0000 mL | Freq: Once | INTRAVENOUS | Status: AC | PRN
  Administered 2018-08-29: 100 mL via INTRAVENOUS

## 2018-09-05 DIAGNOSIS — Z23 Encounter for immunization: Secondary | ICD-10-CM | POA: Diagnosis not present

## 2018-09-14 DIAGNOSIS — N949 Unspecified condition associated with female genital organs and menstrual cycle: Secondary | ICD-10-CM | POA: Diagnosis not present

## 2018-09-14 DIAGNOSIS — N83202 Unspecified ovarian cyst, left side: Secondary | ICD-10-CM | POA: Diagnosis not present

## 2018-09-23 DIAGNOSIS — N9489 Other specified conditions associated with female genital organs and menstrual cycle: Secondary | ICD-10-CM | POA: Diagnosis not present

## 2018-09-23 DIAGNOSIS — N83202 Unspecified ovarian cyst, left side: Secondary | ICD-10-CM | POA: Diagnosis not present

## 2018-09-26 ENCOUNTER — Other Ambulatory Visit: Payer: Self-pay | Admitting: Obstetrics and Gynecology

## 2018-09-26 DIAGNOSIS — N9489 Other specified conditions associated with female genital organs and menstrual cycle: Secondary | ICD-10-CM

## 2018-10-04 ENCOUNTER — Telehealth: Payer: Self-pay | Admitting: *Deleted

## 2018-10-04 NOTE — Telephone Encounter (Signed)
Called and scheduled the patient for a new patient appt on 12/11 at 12:15 pm.

## 2018-10-09 ENCOUNTER — Ambulatory Visit
Admission: RE | Admit: 2018-10-09 | Discharge: 2018-10-09 | Disposition: A | Discharge status: Home / Self Care | Payer: Medicare Other | Source: Ambulatory Visit | Attending: Obstetrics and Gynecology | Admitting: Obstetrics and Gynecology

## 2018-10-09 DIAGNOSIS — R1909 Other intra-abdominal and pelvic swelling, mass and lump: Secondary | ICD-10-CM | POA: Diagnosis not present

## 2018-10-09 DIAGNOSIS — N9489 Other specified conditions associated with female genital organs and menstrual cycle: Secondary | ICD-10-CM

## 2018-10-09 IMAGING — MR MRI PELVIS WITHOUT AND WITH CONTRAST
7 of 11 series · 33 of 48 positions shown · IV contrast (10cc multihance)
Comparison: CT on 08/29/2018

CLINICAL DATA: 20 lb weight loss in past 3 months. Left adnexal
mass on recent CT.

Creatinine was obtained on site at [HOSPITAL] at [HOSPITAL].
Results: Creatinine 0.5 mg/dL.
EXAM:
MRI PELVIS WITHOUT AND WITH CONTRAST
TECHNIQUE: Multiplanar multisequence MR imaging of the pelvis was performed
both before and after administration of intravenous contrast.
CONTRAST:  10mL MULTIHANCE GADOBENATE DIMEGLUMINE 529 MG/ML IV SOLN

[Series 2: cor haste · coronal · 6.0mm · 0.78mm/px · 3 of 25 slices shown]
[im 1/25]
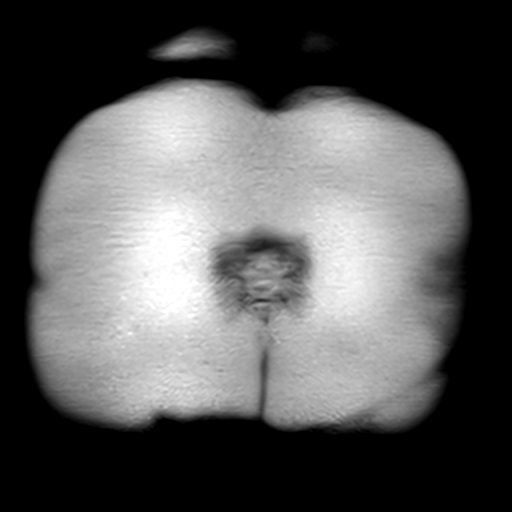
[im 13/25]
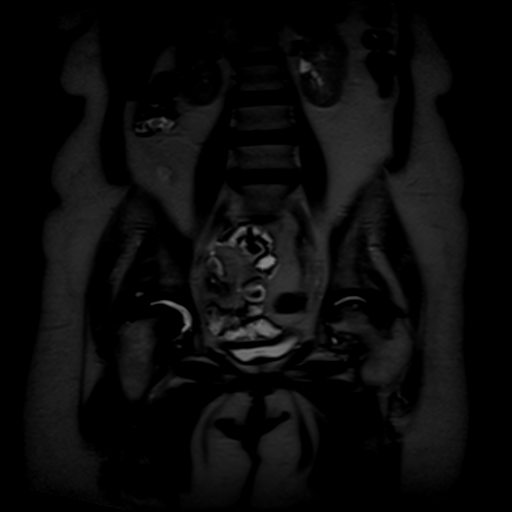
[im 25/25]
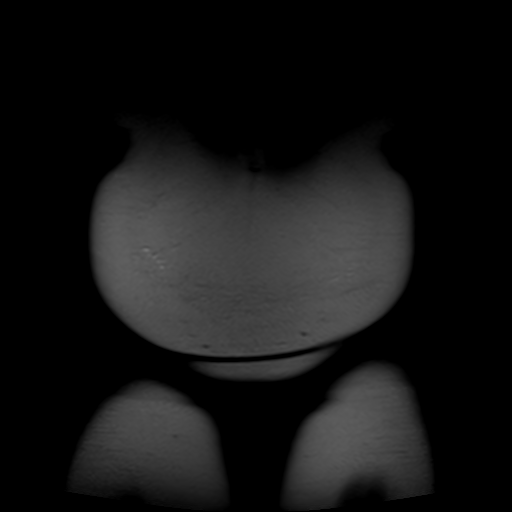

[Series 3: t2_tse_sag · sagittal · 5.0mm · 1.05mm/px · 5 of 27 slices shown]
[im 1/27]
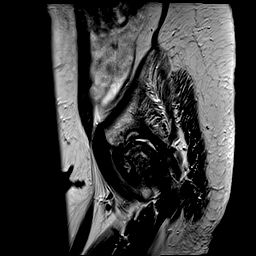
[im 7/27]
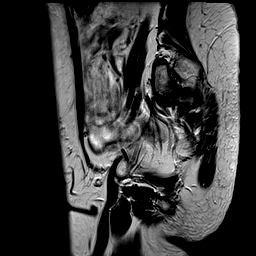
[im 14/27]
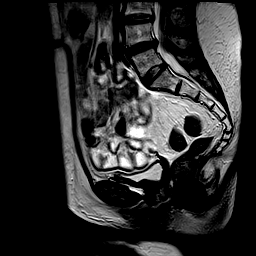
[im 20/27]
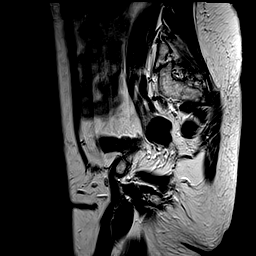
[im 27/27]
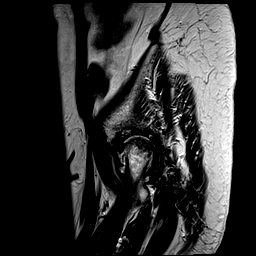

[Series 4: t2_tse axial · axial · 7.0mm · 0.98mm/px · z∈[-100,+164]mm · 5 of 30 slices shown]
[im 1/30]
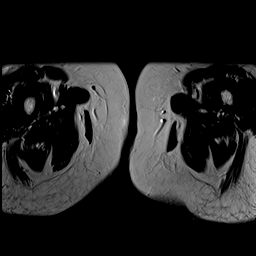
[im 8/30]
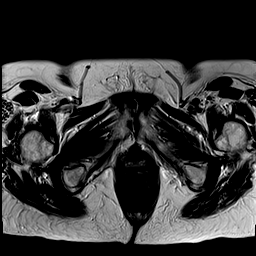
[im 15/30]
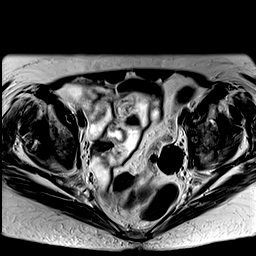
[im 22/30]
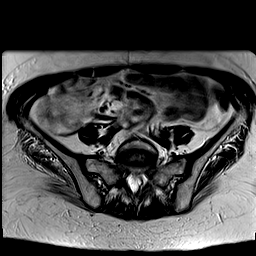
[im 30/30]
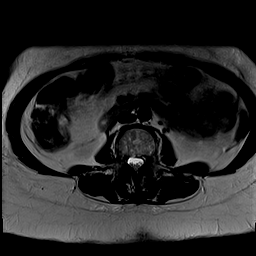

[Series 5: t2_tse axial fs · axial · 7.0mm · 0.98mm/px · z∈[-100,+164]mm · 5 of 30 slices shown]
[im 1/30]
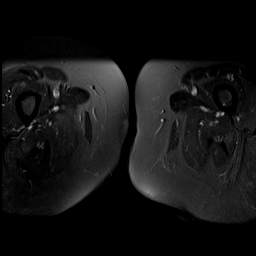
[im 8/30]
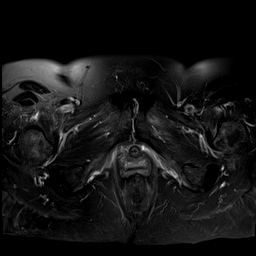
[im 15/30]
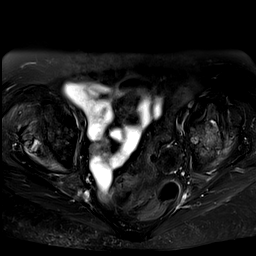
[im 22/30]
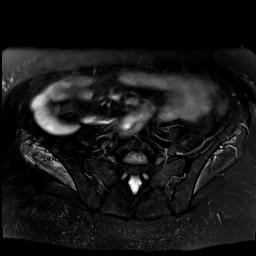
[im 30/30]
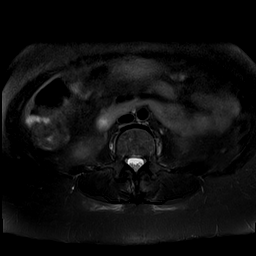

[Series 6: axial spgr · axial · 7.0mm · 0.98mm/px · z∈[-100,+164]mm · 5 of 30 slices shown]
[im 1/30]
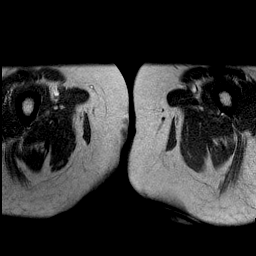
[im 8/30]
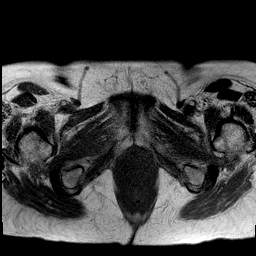
[im 15/30]
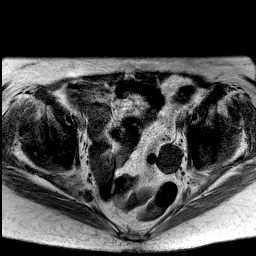
[im 22/30]
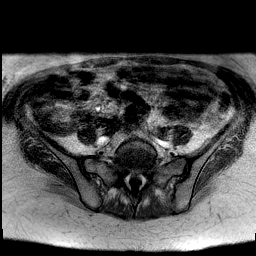
[im 30/30]
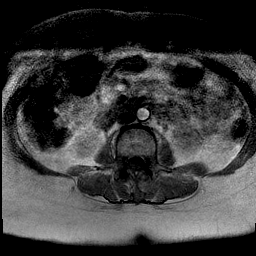

[Series 7: axial spgr pre · axial · non-contrast · 7.0mm · 0.49mm/px · z∈[-100,+164]mm · 5 of 30 slices shown]
[im 1/30]
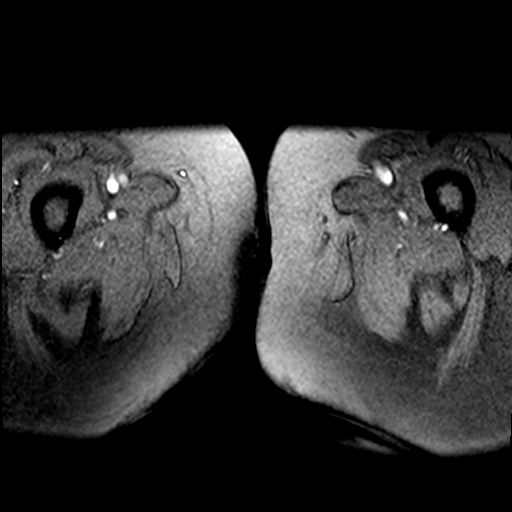
[im 8/30]
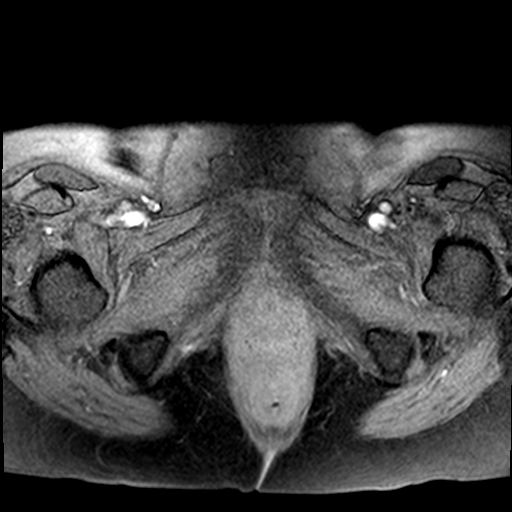
[im 15/30]
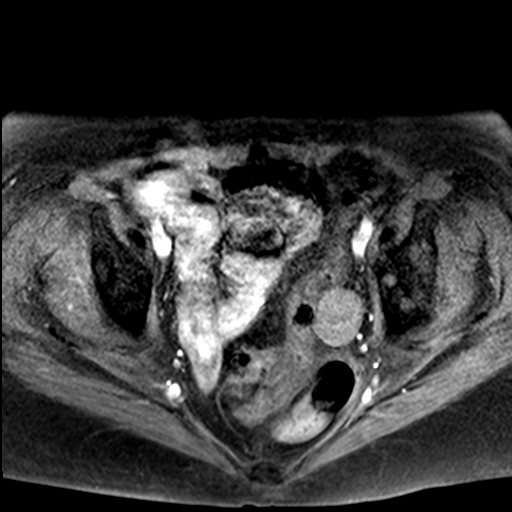
[im 22/30]
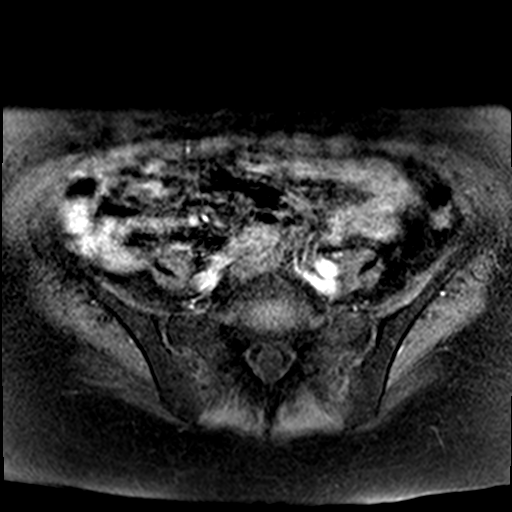
[im 30/30]
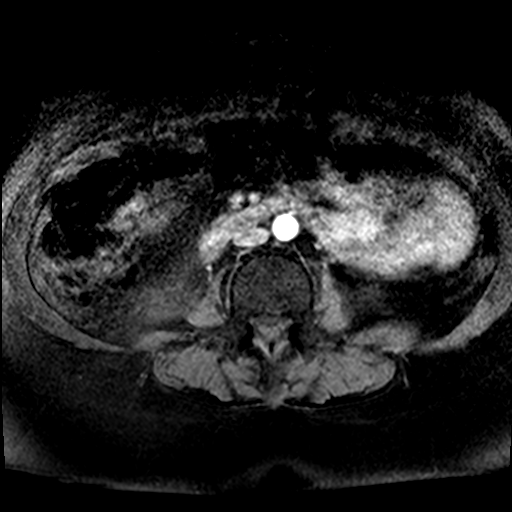

[Series 8: axial spgr post · axial · 7.0mm · 0.49mm/px · z∈[-100,+164]mm · 5 of 30 slices shown]
[im 1/30]
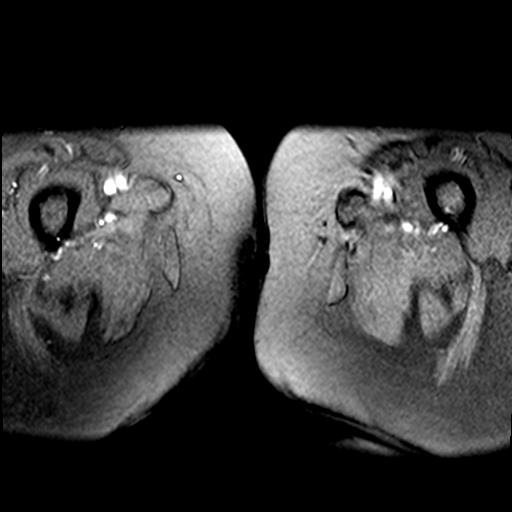
[im 8/30]
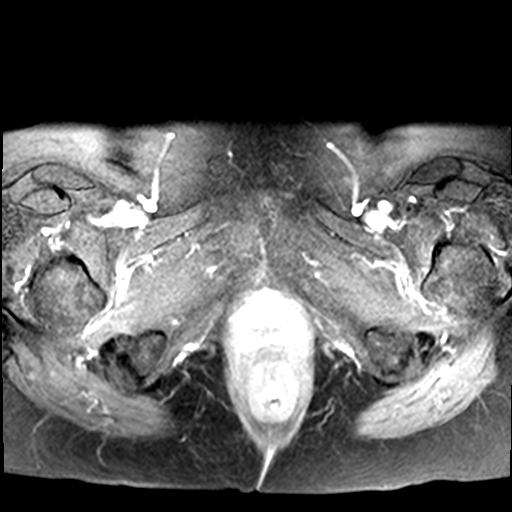
[im 15/30]
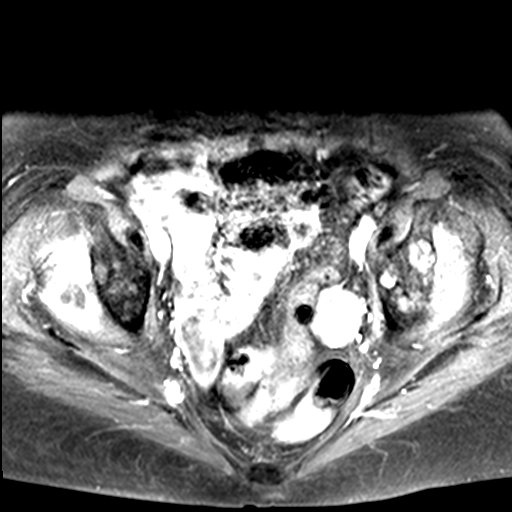
[im 22/30]
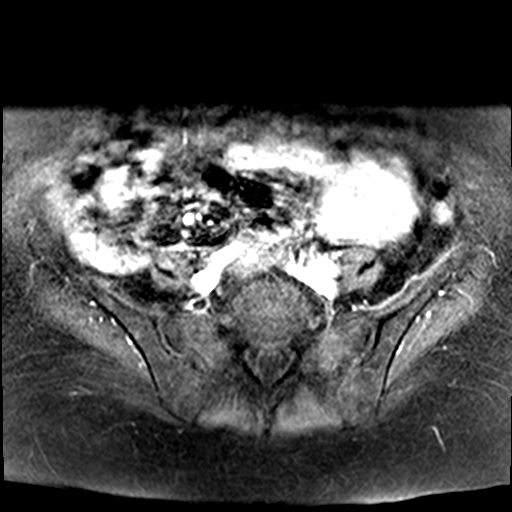
[im 30/30]
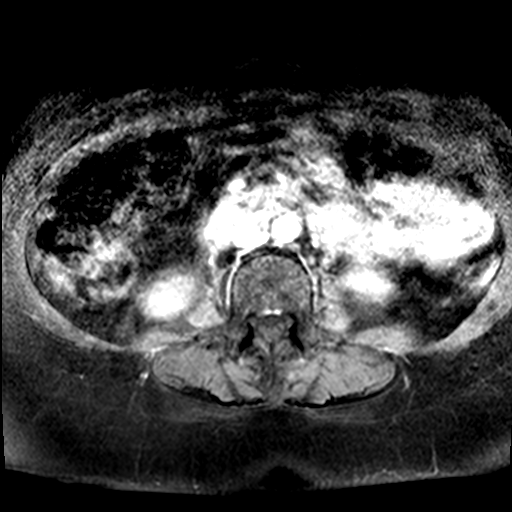

[33 of 48 positions shown; findings below may reference images not displayed]

FINDINGS: Urinary Tract: No urinary bladder or urethral abnormality.

Bowel: Unremarkable pelvic bowel loops.

Vascular/Lymphatic: Unremarkable. No pathologically enlarged pelvic
lymph nodes identified.

Reproductive:

-- Uterus:  Surgically absent.

-- Right ovary: Not visualized, however no definite adnexal mass
identified.

-- Left ovary: A homogeneous solid enhancing mass is seen which
shows diffuse T2 hypointensity and measures 3.0 x 2.3 cm.
Differential diagnosis includes ovarian fibrothecoma and broad
ligament fibroid.

Other: No peritoneal thickening or abnormal free fluid.

Musculoskeletal:  Unremarkable.
IMPRESSION: 3.0 cm solid T2 hypointense mass in left adnexa. This is suspicious
for an ovarian fibrothecoma, however differential diagnosis also
includes a broad ligament fibroid.

Previous hysterectomy.

## 2018-10-09 MED ORDER — GADOBENATE DIMEGLUMINE 529 MG/ML IV SOLN
10.0000 mL | Freq: Once | INTRAVENOUS | Status: AC | PRN
  Administered 2018-10-09: 10 mL via INTRAVENOUS

## 2018-10-10 NOTE — Progress Notes (Signed)
Updated med list from dr office.

## 2018-10-12 ENCOUNTER — Inpatient Hospital Stay: Payer: Medicare Other | Attending: Obstetrics | Admitting: Obstetrics

## 2018-10-12 ENCOUNTER — Encounter: Payer: Self-pay | Admitting: Obstetrics

## 2018-10-12 VITALS — BP 148/85 | HR 78 | Temp 98.3°F | Resp 16 | Ht 59.0 in | Wt 117.0 lb

## 2018-10-12 DIAGNOSIS — N9489 Other specified conditions associated with female genital organs and menstrual cycle: Secondary | ICD-10-CM | POA: Insufficient documentation

## 2018-10-12 DIAGNOSIS — D398 Neoplasm of uncertain behavior of other specified female genital organs: Secondary | ICD-10-CM

## 2018-10-12 NOTE — Progress Notes (Addendum)
Consult Note: New Patient First Visit   Consult was requested by Dr. Christophe Louis for an adnexal mass (h/o hysterectomy)   Chief Complaint  Patient presents with  . Adnexal mass    GYN Oncologic Summary 1. TBD o .  HPI: [name]  is a very nice 77 y.o.  P1  She went to see a GI provider due to 2 months of diarrhea and weight loss. A CTScan was ordered and a pelvic mass seen. (3.1 cm left adnexal mass, which could represent a complex cyst or solid mass)   She was referred onto Dr. Christophe Louis who performed a TVUS and ordered CA125. The CA125 was normal. Per the patient because the ultrasound was inconclusive she was sent for a pelvic MRI. (See below)  The TVUS 09/23/18 revealed an enlarged (nonvascular) left ovary with notation the images were poor. The left adnexa was estimated to measure 2.4x2.8x1.9cm.  The MRI 10/10/18 - Left ovary: A homogeneous solid enhancing mass is seen which shows diffuse T2 hypointensity and measures 3.0 x 2.3 cm. Differential diagnosis includes ovarian fibrothecoma and broad ligament fibroid. Other: No peritoneal thickening or abnormal free fluid.  NO LAD  CA125 normal  09/14/18 = 17.4  States the thrombocytopenia resolved. I see in 2015 she had 110's on her platelet, but normal in 2017  Imported EPIC Oncologic History:   No history exists.    Measurement of disease: TBD Radiology: Mr Pelvis W Wo Contrast  Result Date: 10/10/2018 CLINICAL DATA:  20 lb weight loss in past 3 months. Left adnexal mass on recent CT. Creatinine was obtained on site at [facility] at [address] Results: Creatinine 0.5 mg/dL. EXAM: MRI PELVIS WITHOUT AND WITH CONTRAST TECHNIQUE: Multiplanar multisequence MR imaging of the pelvis was performed both before and after administration of intravenous contrast. CONTRAST:  65mL MULTIHANCE GADOBENATE DIMEGLUMINE 529 MG/ML IV SOLN COMPARISON:  CT on 08/29/2018 FINDINGS: Urinary Tract: No urinary bladder or urethral  abnormality. Bowel: Unremarkable pelvic bowel loops. Vascular/Lymphatic: Unremarkable. No pathologically enlarged pelvic lymph nodes identified. Reproductive: -- Uterus:  Surgically absent. -- Right ovary: Not visualized, however no definite adnexal mass identified. -- Left ovary: A homogeneous solid enhancing mass is seen which shows diffuse T2 hypointensity and measures 3.0 x 2.3 cm. Differential diagnosis includes ovarian fibrothecoma and broad ligament fibroid. Other: No peritoneal thickening or abnormal free fluid. Musculoskeletal:  Unremarkable. IMPRESSION: 3.0 cm solid T2 hypointense mass in left adnexa. This is suspicious for an ovarian fibrothecoma, however differential diagnosis also includes a broad ligament fibroid. Previous hysterectomy. Electronically Signed   By: Earle Gell M.D.   On: 10/10/2018 08:32   Ct Abdomen Pelvis W Contrast  Result Date: 08/29/2018 CLINICAL DATA:  Abnormal weight loss of 20 lb in past 3 months. Non localized abdominal pain. Nausea and diarrhea. EXAM: CT ABDOMEN AND PELVIS WITH CONTRAST TECHNIQUE: Multidetector CT imaging of the abdomen and pelvis was performed using the standard protocol following bolus administration of intravenous contrast. CONTRAST:  141mL ISOVUE-300 IOPAMIDOL (ISOVUE-300) INJECTION 61% COMPARISON:  06/11/2006 FINDINGS: Lower Chest: No acute findings. Hepatobiliary: No hepatic masses identified. Gallbladder is nearly completely empty. No evidence of biliary ductal dilatation. Pancreas:  No mass or inflammatory changes. Spleen: Within normal limits in size and appearance. Adrenals/Urinary Tract: No masses identified. No evidence of hydronephrosis. Stomach/Bowel: No evidence of obstruction, inflammatory process or abnormal fluid collections. Normal appendix visualized. Vascular/Lymphatic: No pathologically enlarged lymph nodes. No abdominal aortic aneurysm. Reproductive: Previous hysterectomy. A masses seen  in the left adnexa measuring 3.1 x 2.7 cm  which has higher than fluid attenuation. This could represent a complex cyst or enhancing mass. No other pelvic mass or free fluid identified. Other:  No evidence peritoneal thickening or ascites. Musculoskeletal: No suspicious bone lesions identified. Severe bilateral hip arthritis. IMPRESSION: 3.1 cm left adnexal mass, which could represent a complex cyst or solid mass. Recommend transvaginal pelvic ultrasound for further evaluation. Electronically Signed   By: Earle Gell M.D.   On: 08/29/2018 12:53   .   Outpatient Encounter Medications as of 10/12/2018  Medication Sig  . amLODipine (NORVASC) 5 MG tablet 5 mg daily.   Marland Kitchen atorvastatin (LIPITOR) 20 MG tablet Take 20 mg by mouth at bedtime.   Marland Kitchen losartan (COZAAR) 100 MG tablet 100 mg daily.   . metFORMIN (GLUCOPHAGE) 1000 MG tablet TK 1 T PO  BID  . metoprolol succinate (TOPROL-XL) 25 MG 24 hr tablet   . naproxen sodium (ALEVE) 220 MG tablet Take 220 mg by mouth 2 (two) times daily as needed.  . meclizine (ANTIVERT) 25 MG tablet   . [DISCONTINUED] ALPRAZolam (XANAX) 0.5 MG tablet Take 0.5 mg by mouth 3 (three) times daily as needed for anxiety.  . [DISCONTINUED] azithromycin (ZITHROMAX) 250 MG tablet   . [DISCONTINUED] ibuprofen (ADVIL,MOTRIN) 800 MG tablet   . [DISCONTINUED] meloxicam (MOBIC) 7.5 MG tablet   . [DISCONTINUED] VENTOLIN HFA 108 (90 Base) MCG/ACT inhaler    No facility-administered encounter medications on file as of 10/12/2018.    Allergies  Allergen Reactions  . Codeine Nausea Only    Past Medical History:  Diagnosis Date  . Anxiety   . DM (diabetes mellitus) ([place])   . High cholesterol   . HTN (hypertension)   . Thrombocytopenia ([place])    Past Surgical History:  Procedure Laterality Date  . BREAST EXCISIONAL BIOPSY Right 1992   benign  . COLONOSCOPY    . VAGINAL HYSTERECTOMY  1980's   Ovaries not removed        Past Gynecological History:   GYNECOLOGIC HISTORY:  . No LMP recorded. Patient is postmenopausal.   Hysterectomy 1980 . Menarche: 77 years old . P 1 . Contraceptive h/o OCP . HRT None  . Last Pap "always normal" NA since hyst Family Hx:  Family History  Problem Relation Age of Onset  . Diabetes Mother   . Hypertension Mother   . Diabetes Father   . Hypertension Father   . Heart failure Father   . Hypertension Sister   . Heart failure Sister   . Diabetes Sister   . Diabetes Brother   . Hypertension Brother    Social Hx:  Marland Kitchen Tobacco use: former smoker . Alcohol use: none . Illicit Drug use: none . Illicit IV Drug use: none    Review of Systems: Review of Systems  Constitutional: Positive for unexpected weight change.  Musculoskeletal: Positive for arthralgias and gait problem.  Neurological: Positive for gait problem.  All other systems reviewed and are negative.   Vitals:  Vitals:   10/12/18 1236  BP: (!) 148/85  Pulse: 78  Resp: 16  Temp: 98.3 F (36.8 C)  SpO2: 100%   Vitals:   10/12/18 1236  Weight: 117 lb (53.1 kg)  Height: 4\' 11"  (1.499 m)   Body mass index is 23.63 kg/m.  Physical Exam: General :  Well developed, 77 y.o., female in no apparent distress HEENT:  Normocephalic/atraumatic, symmetric, EOMI, eyelids normal Neck:   Supple, no masses.  Lymphatics:  No cervical/ submandibular/ supraclavicular/ infraclavicular/ inguinal adenopathy Respiratory:  Respirations unlabored, no use of accessory muscles CV:   Deferred Breast:  Deferred Musculoskeletal: No CVA tenderness, normal muscle strength. Abdomen:  Soft, non-tender and nondistended. No evidence of hernia. No masses. Extremities:  No lymphedema, no erythema, non-tender. Skin:   Normal inspection Neuro/Psych:  No focal motor deficit, no abnormal mental status. Normal gait. Normal affect. Alert and oriented to person, place, and time  Genito Urinary: Vulva: Normal external female genitalia.  Bladder/urethra: Urethral meatus normal in size and location. No lesions or   masses, well supported  bladder Speculum exam: Vagina: No lesion, no discharge, no bleeding. Bimanual exam: No mass on palpation, no lesions  Cervix/Uterus: Surgically absent  Adnexal region: No masses. Rectovaginal:  Good tone, no masses, no cul de sac nodularity, no parametrial involvement or nodularity.   Assessment  Pelvic mass  Plan   1. Pelvic mass ? Imaging was personally reviewed by me with the patient today. ? This appears to be a left sided lesion with no obvious extrapelvic disease 2. Management ? Tumor markers with normal CA125 was reviewed ? Solid adnexal mass in a postmenopausal women leads to recommendation for surgical excision. 3. Surgical discussion ? We agreed on a plan for laparoscopic robotic BSO she does understand there is a risk regardless of the intent for minimally invasive procedure that laparotomy may be the final modality ? We discussed if malignancy is suspected at the time of surgery that BSO and staging would be performed.  She understands this may be via laparotomy. ? If benign she favors removal of the other adnexa but understands we will not put her at risk for injury should that be deemed a difficult removal. She is open to both tubes being removed if the normal contralateral ovary remains. ? Surgical sketch was reviewed including the risk, benefits, alternatives of the procedure ? Her questions were answered to her satisfaction. ? The patient was given a copy of the surgical sketch the end of the visit. 4. Return to clinic within 3 weeks postop to review pathology and evaluate incisions 5. She needs to weight loss workup to continue perhaps through GI if we do not find an explanation.    Mart Piggs, MD Gynecologic Oncologist 10/12/2018, 5:37 PM    Cc: Christophe Louis, MD (Referring Ob/Gyn) Charolette Forward, MD  (PCP)

## 2018-10-12 NOTE — H&P (View-Only) (Signed)
[place] [facility] Note: New Patient First Visit   Consult was requested by Dr. Christophe Louis for an adnexal mass (h/o hysterectomy)   Chief Complaint  Patient presents with  . Adnexal mass    GYN Oncologic Summary 1. TBD o .  HPI: [name]  is a very nice 77 y.o.  P1  She went to see a GI provider due to 2 months of diarrhea and weight loss. A CTScan was ordered and a pelvic mass seen. (3.1 cm left adnexal mass, which could represent a complex cyst or solid mass)   She was referred onto Dr. Christophe Louis who performed a TVUS and ordered CA125. The CA125 was normal. Per the patient because the ultrasound was inconclusive she was sent for a pelvic MRI. (See below)  The TVUS 09/23/18 revealed an enlarged (nonvascular) left ovary with notation the images were poor. The left adnexa was estimated to measure 2.4x2.8x1.9cm.  The MRI 10/10/18 - Left ovary: A homogeneous solid enhancing mass is seen which shows diffuse T2 hypointensity and measures 3.0 x 2.3 cm. Differential diagnosis includes ovarian fibrothecoma and broad ligament fibroid. Other: No peritoneal thickening or abnormal free fluid.  NO LAD  CA125 normal  09/14/18 = 17.4  States the thrombocytopenia resolved. I see in 2015 she had 110's on her platelet, but normal in 2017  Imported EPIC Oncologic History:   No history exists.    Measurement of disease: TBD Radiology: Mr Pelvis W Wo Contrast  Result Date: 10/10/2018 CLINICAL DATA:  20 lb weight loss in past 3 months. Left adnexal mass on recent CT. Creatinine was obtained on site at [facility] at [address] Results: Creatinine 0.5 mg/dL. EXAM: MRI PELVIS WITHOUT AND WITH CONTRAST TECHNIQUE: Multiplanar multisequence MR imaging of the pelvis was performed both before and after administration of intravenous contrast. CONTRAST:  18mL MULTIHANCE GADOBENATE DIMEGLUMINE 529 MG/ML IV SOLN COMPARISON:  CT on  08/29/2018 FINDINGS: Urinary Tract: No urinary bladder or urethral abnormality. Bowel: Unremarkable pelvic bowel loops. Vascular/Lymphatic: Unremarkable. No pathologically enlarged pelvic lymph nodes identified. Reproductive: -- Uterus:  Surgically absent. -- Right ovary: Not visualized, however no definite adnexal mass identified. -- Left ovary: A homogeneous solid enhancing mass is seen which shows diffuse T2 hypointensity and measures 3.0 x 2.3 cm. Differential diagnosis includes ovarian fibrothecoma and broad ligament fibroid. Other: No peritoneal thickening or abnormal free fluid. Musculoskeletal:  Unremarkable. IMPRESSION: 3.0 cm solid T2 hypointense mass in left adnexa. This is suspicious for an ovarian fibrothecoma, however differential diagnosis also includes a broad ligament fibroid. Previous hysterectomy. Electronically Signed   By: Earle Gell M.D.   On: 10/10/2018 08:32   Ct Abdomen Pelvis W Contrast  Result Date: 08/29/2018 CLINICAL DATA:  Abnormal weight loss of 20 lb in past 3 months. Non localized abdominal pain. Nausea and diarrhea. EXAM: CT ABDOMEN AND PELVIS WITH CONTRAST TECHNIQUE: Multidetector CT imaging of the abdomen and pelvis was performed using the standard protocol following bolus administration of intravenous contrast. CONTRAST:  143mL ISOVUE-300 IOPAMIDOL (ISOVUE-300) INJECTION 61% COMPARISON:  06/11/2006 FINDINGS: Lower Chest: No acute findings. Hepatobiliary: No hepatic masses identified. Gallbladder is nearly completely empty. No evidence of biliary ductal dilatation. Pancreas:  No mass or inflammatory changes. Spleen: Within normal limits in size and appearance. Adrenals/Urinary Tract: No masses identified. No evidence of hydronephrosis. Stomach/Bowel: No evidence of obstruction, inflammatory process or abnormal fluid collections. Normal appendix visualized. Vascular/Lymphatic: No pathologically enlarged lymph  nodes. No abdominal aortic aneurysm. Reproductive: Previous  hysterectomy. A masses seen in the left adnexa measuring 3.1 x 2.7 cm which has higher than fluid attenuation. This could represent a complex cyst or enhancing mass. No other pelvic mass or free fluid identified. Other:  No evidence peritoneal thickening or ascites. Musculoskeletal: No suspicious bone lesions identified. Severe bilateral hip arthritis. IMPRESSION: 3.1 cm left adnexal mass, which could represent a complex cyst or solid mass. Recommend transvaginal pelvic ultrasound for further evaluation. Electronically Signed   By: Earle Gell M.D.   On: 08/29/2018 12:53   .   Outpatient Encounter Medications as of 10/12/2018  Medication Sig  . amLODipine (NORVASC) 5 MG tablet 5 mg daily.   Marland Kitchen atorvastatin (LIPITOR) 20 MG tablet Take 20 mg by mouth at bedtime.   Marland Kitchen losartan (COZAAR) 100 MG tablet 100 mg daily.   . metFORMIN (GLUCOPHAGE) 1000 MG tablet TK 1 T PO  BID  . metoprolol succinate (TOPROL-XL) 25 MG 24 hr tablet   . naproxen sodium (ALEVE) 220 MG tablet Take 220 mg by mouth 2 (two) times daily as needed.  . meclizine (ANTIVERT) 25 MG tablet   . [DISCONTINUED] ALPRAZolam (XANAX) 0.5 MG tablet Take 0.5 mg by mouth 3 (three) times daily as needed for anxiety.  . [DISCONTINUED] azithromycin (ZITHROMAX) 250 MG tablet   . [DISCONTINUED] ibuprofen (ADVIL,MOTRIN) 800 MG tablet   . [DISCONTINUED] meloxicam (MOBIC) 7.5 MG tablet   . [DISCONTINUED] VENTOLIN HFA 108 (90 Base) MCG/ACT inhaler    No facility-administered encounter medications on file as of 10/12/2018.    Allergies  Allergen Reactions  . Codeine Nausea Only    Past Medical History:  Diagnosis Date  . Anxiety   . DM (diabetes mellitus) ([place])   . High cholesterol   . HTN (hypertension)   . Thrombocytopenia ([place])    Past Surgical History:  Procedure Laterality Date  . BREAST EXCISIONAL BIOPSY Right 1992   benign  . COLONOSCOPY    . VAGINAL HYSTERECTOMY  1980's   Ovaries not removed        Past Gynecological History:    GYNECOLOGIC HISTORY:  . No LMP recorded. Patient is postmenopausal.  Hysterectomy 1980 . Menarche: 77 years old . P 1 . Contraceptive h/o OCP . HRT None  . Last Pap "always normal" NA since hyst Family Hx:  Family History  Problem Relation Age of Onset  . Diabetes Mother   . Hypertension Mother   . Diabetes Father   . Hypertension Father   . Heart failure Father   . Hypertension Sister   . Heart failure Sister   . Diabetes Sister   . Diabetes Brother   . Hypertension Brother    Social Hx:  Marland Kitchen Tobacco use: former smoker . Alcohol use: none . Illicit Drug use: none . Illicit IV Drug use: none    Review of Systems: Review of Systems  Constitutional: Positive for unexpected weight change.  Musculoskeletal: Positive for arthralgias and gait problem.  Neurological: Positive for gait problem.  All other systems reviewed and are negative.   Vitals:  Vitals:   10/12/18 1236  BP: (!) 148/85  Pulse: 78  Resp: 16  Temp: 98.3 F (36.8 C)  SpO2: 100%   Vitals:   10/12/18 1236  Weight: 117 lb (53.1 kg)  Height: 4\' 11"  (1.499 m)   Body mass index is 23.63 kg/m.  Physical Exam: General :  Well developed, 77 y.o., female in no apparent distress HEENT:  Normocephalic/atraumatic, symmetric, EOMI, eyelids normal Neck:   Supple, no masses.  Lymphatics:  No cervical/ submandibular/ supraclavicular/ infraclavicular/ inguinal adenopathy Respiratory:  Respirations unlabored, no use of accessory muscles CV:   Deferred Breast:  Deferred Musculoskeletal: No CVA tenderness, normal muscle strength. Abdomen:  Soft, non-tender and nondistended. No evidence of hernia. No masses. Extremities:  No lymphedema, no erythema, non-tender. Skin:   Normal inspection Neuro/Psych:  No focal motor deficit, no abnormal mental status. Normal gait. Normal affect. Alert and oriented to person, place, and time  Genito Urinary: Vulva: Normal external female genitalia.  Bladder/urethra: Urethral  meatus normal in size and location. No lesions or   masses, well supported bladder Speculum exam: Vagina: No lesion, no discharge, no bleeding. Bimanual exam: No mass on palpation, no lesions  Cervix/Uterus: Surgically absent  Adnexal region: No masses. Rectovaginal:  Good tone, no masses, no cul de sac nodularity, no parametrial involvement or nodularity.   Assessment  Pelvic mass  Plan   1. Pelvic mass ? Imaging was personally reviewed by me with the patient today. ? This appears to be a left sided lesion with no obvious extrapelvic disease 2. Management ? Tumor markers with normal CA125 was reviewed ? Solid adnexal mass in a postmenopausal women leads to recommendation for surgical excision. 3. Surgical discussion ? We agreed on a plan for laparoscopic robotic BSO she does understand there is a risk regardless of the intent for minimally invasive procedure that laparotomy may be the final modality ? We discussed if malignancy is suspected at the time of surgery that BSO and staging would be performed.  She understands this may be via laparotomy. ? If benign she favors removal of the other adnexa but understands we will not put her at risk for injury should that be deemed a difficult removal. She is open to both tubes being removed if the normal contralateral ovary remains. ? Surgical sketch was reviewed including the risk, benefits, alternatives of the procedure ? Her questions were answered to her satisfaction. ? The patient was given a copy of the surgical sketch the end of the visit. 4. Return to clinic within 3 weeks postop to review pathology and evaluate incisions 5. She needs to weight loss workup to continue perhaps through GI if we do not find an explanation.    Mart Piggs, MD Gynecologic Oncologist 10/12/2018, 5:37 PM    Cc: Christophe Louis, MD (Referring Ob/Gyn) Charolette Forward, MD  (PCP)

## 2018-10-12 NOTE — Patient Instructions (Signed)
Preparing for your Surgery  Plan for surgery on October 24, 2018 with Dr. Everitt Amber at [facility] will be scheduled for a robotic assisted bilateral salpingo-oophorectomy, possible staging.   Pre-operative Testing -You will receive a phone call from presurgical testing at [facility] to arrange for a pre-operative testing appointment before your surgery.  This appointment normally occurs one to two weeks before your scheduled surgery.   -Bring your insurance card, copy of an advanced directive if applicable, medication list  -At that visit, you will be asked to sign a consent for a possible blood transfusion in case a transfusion becomes necessary during surgery.  The need for a blood transfusion is rare but having consent is a necessary part of your care.     -You should not be taking blood thinners or aspirin at least ten days prior to surgery unless instructed by your surgeon.  Day Before Surgery at [facility] will be asked to take in a light diet the day before surgery.  Avoid carbonated beverages.  You will be advised to have nothing to eat or drink after midnight the evening before.    Eat a light diet the day before surgery.  Examples including soups, broths, toast, yogurt, mashed potatoes.  Things to avoid include carbonated beverages (fizzy beverages), raw fruits and raw vegetables, or beans.   If your bowels are filled with gas, your surgeon will have difficulty visualizing your pelvic organs which increases your surgical risks.  Your role in recovery Your role is to become active as soon as directed by your doctor, while still giving yourself time to heal.  Rest when you feel tired. You will be asked to do the following in order to speed your recovery:  - Cough and breathe deeply. This helps toclear and expand your lungs and can prevent pneumonia. You may be given a spirometer to practice deep breathing. A staff member will show you how to use  the spirometer. - Do mild physical activity. Walking or moving your legs help your circulation and body functions return to normal. A staff member will help you when you try to walk and will provide you with simple exercises. Do not try to get up or walk alone the first time. - Actively manage your pain. Managing your pain lets you move in comfort. We will ask you to rate your pain on a scale of zero to 10. It is your responsibility to tell your doctor or nurse where and how much you hurt so your pain can be treated.  Special Considerations -If you are diabetic, you may be placed on insulin after surgery to have closer control over your blood sugars to promote healing and recovery.  This does not mean that you will be discharged on insulin.  If applicable, your oral antidiabetics will be resumed when you are tolerating a solid diet.  -Your final pathology results from surgery should be available around one week after surgery and the results will be relayed to you when available.  -Dr. Precious Haws is the Surgeon that assists your GYN Oncologist with surgery.  The next day after your surgery you will either see your GYN Oncologist, Dr. Precious Haws, or Dr. Lahoma Crocker.  -FMLA forms can be faxed to [phone] and please allow 5-7 business days for completion.   Blood Transfusion Information WHAT IS A BLOOD TRANSFUSION? A transfusion is the replacement of blood or some of its parts. Blood is made up of multiple cells  which provide different functions.  Red blood cells carry oxygen and are used for blood loss replacement.  White blood cells fight against infection.  Platelets control bleeding.  Plasma helps clot blood.  Other blood products are available for specialized needs, such as hemophilia or other clotting disorders. BEFORE THE TRANSFUSION  Who gives blood for transfusions?   You may be able to donate blood to be used at a later date on yourself (autologous  donation).  Relatives can be asked to donate blood. This is generally not any safer than if you have received blood from a stranger. The same precautions are taken to ensure safety when a relative's blood is donated.  Healthy volunteers who are fully evaluated to make sure their blood is safe. This is blood bank blood. Transfusion therapy is the safest it has ever been in the practice of medicine. Before blood is taken from a donor, a complete history is taken to make sure that person has no history of diseases nor engages in risky social behavior (examples are intravenous drug use or sexual activity with multiple partners). The donor's travel history is screened to minimize risk of transmitting infections, such as malaria. The donated blood is tested for signs of infectious diseases, such as HIV and hepatitis. The blood is then tested to be sure it is compatible with you in order to minimize the chance of a transfusion reaction. If you or a relative donates blood, this is often done in anticipation of surgery and is not appropriate for emergency situations. It takes many days to process the donated blood. RISKS AND COMPLICATIONS Although transfusion therapy is very safe and saves many lives, the main dangers of transfusion include:   Getting an infectious disease.  Developing a transfusion reaction. This is an allergic reaction to something in the blood you were given. Every precaution is taken to prevent this. The decision to have a blood transfusion has been considered carefully by your caregiver before blood is given. Blood is not given unless the benefits outweigh the risks.

## 2018-10-17 NOTE — Patient Instructions (Addendum)
Darlene Jones  10/17/2018   Your procedure is scheduled on: 10-24-18    Report to [facility] Main  Entrance    Report to Admitting at 10:26 AM    Eat a light diet the day before surgery.  Examples including soups, broths, toast, yogurt, mashed potatoes.  Things to avoid include carbonated beverages (fizzy beverages), raw fruits and raw  vegetables, or beans.    If your bowels are filled with gas, your surgeon will have difficulty visualizing your pelvic organs which increases your surgical risks.   Call this number if you have problems the morning of surgery [phone]    Remember: Do not eat food or drink liquids :After Midnight.    BRUSH YOUR TEETH MORNING OF SURGERY AND RINSE YOUR MOUTH OUT, NO CHEWING GUM CANDY OR MINTS.     Take these medicines the morning of surgery with A SIP OF WATER: Amlodipine (Norvasc) and Metoprolol Succinate (Toprol)   DO NOT TAKE ANY DIABETIC MEDICATIONS DAY OF YOUR SURGERY                               You may not have any metal on your body including hair pins and              piercings  Do not wear jewelry, make-up, lotions, powders or perfumes, deodorant             Do not wear nail polish.  Do not shave  48 hours prior to surgery.                Do not bring valuables to the hospital. [place].  Contacts, dentures or bridgework may not be worn into surgery.      Patients discharged the day of surgery will not be allowed to drive home.  Name and phone number of your driver: Darlene Jones [phone]  Special Instructions: N/A              Please read over the following fact sheets you were given: _____________________________________________________________________ How to Manage Your Diabetes Before and After Surgery  Why is it important to control my blood sugar before and after surgery? . Improving blood sugar levels before and after surgery helps healing  and can limit problems. . A way of improving blood sugar control is eating a healthy diet by: o  Eating less sugar and carbohydrates o  Increasing activity/exercise o  Talking with your doctor about reaching your blood sugar goals . High blood sugars (greater than 180 mg/dL) can raise your risk of infections and slow your recovery, so you will need to focus on controlling your diabetes during the weeks before surgery. . Make sure that the doctor who takes care of your diabetes knows about your planned surgery including the date and location.  How do I manage my blood sugar before surgery? . Check your blood sugar at least 4 times a day, starting 2 days before surgery, to make sure that the level is not too high or low. o Check your blood sugar the morning of your surgery when you wake up and every 2 hours until you get to the Short Stay unit. . If your blood sugar is less than 70 mg/dL, you  will need to treat for low blood sugar: o Do not take insulin. o Treat a low blood sugar (less than 70 mg/dL) with  cup of clear juice (cranberry or apple), 4 glucose tablets, OR glucose gel. o Recheck blood sugar in 15 minutes after treatment (to make sure it is greater than 70 mg/dL). If your blood sugar is not greater than 70 mg/dL on recheck, call [phone] for further instructions. . Report your blood sugar to the short stay nurse when you get to Short Stay.  . If you are admitted to the hospital after surgery: o Your blood sugar will be checked by the staff and you will probably be given insulin after surgery (instead of oral diabetes medicines) to make sure you have good blood sugar levels. o The goal for blood sugar control after surgery is 80-180 mg/dL.   WHAT DO I DO ABOUT MY DIABETES MEDICATION?  Marland Kitchen Do not take oral diabetes medicines (pills) the morning of surgery.  . THE DAY BEFORE SURGERY, take your usual dose of Metformin                    [facility] - Preparing for  Surgery Before surgery, you can play an important role.  Because skin is not sterile, your skin needs to be as free of germs as possible.  You can reduce the number of germs on your skin by washing with CHG (chlorahexidine gluconate) soap before surgery.  CHG is an antiseptic cleaner which kills germs and bonds with the skin to continue killing germs even after washing. Please DO NOT use if you have an allergy to CHG or antibacterial soaps.  If your skin becomes reddened/irritated stop using the CHG and inform your nurse when you arrive at Short Stay. Do not shave (including legs and underarms) for at least 48 hours prior to the first CHG shower.  You may shave your face/neck. Please follow these instructions carefully:  1.  Shower with CHG Soap the night before surgery and the  morning of Surgery.  2.  If you choose to wash your hair, wash your hair first as usual with your  normal  shampoo.  3.  After you shampoo, rinse your hair and body thoroughly to remove the  shampoo.                           4.  Use CHG as you would any other liquid soap.  You can apply chg directly  to the skin and wash                       Gently with a scrungie or clean washcloth.  5.  Apply the CHG Soap to your body ONLY FROM THE NECK DOWN.   Do not use on face/ open                           Wound or open sores. Avoid contact with eyes, ears mouth and genitals (private parts).                       Wash face,  Genitals (private parts) with your normal soap.             6.  Wash thoroughly, paying special attention to the area where your surgery  will be performed.  7.  Thoroughly rinse your body  with warm water from the neck down.  8.  DO NOT shower/wash with your normal soap after using and rinsing off  the CHG Soap.                9.  Pat yourself dry with a clean towel.            10.  Wear clean pajamas.            11.  Place clean sheets on your bed the night of your first shower and do not  sleep with pets. Day  of Surgery : Do not apply any lotions/deodorants the morning of surgery.  Please wear clean clothes to the hospital/surgery center.  FAILURE TO FOLLOW THESE INSTRUCTIONS MAY RESULT IN THE CANCELLATION OF YOUR SURGERY PATIENT SIGNATURE_________________________________  NURSE SIGNATURE__________________________________  ________________________________________________________________________   Adam Phenix  An incentive spirometer is a tool that can help keep your lungs clear and active. This tool measures how well you are filling your lungs with each breath. Taking long deep breaths may help reverse or decrease the chance of developing breathing (pulmonary) problems (especially infection) following:  A long period of time when you are unable to move or be active. BEFORE THE PROCEDURE   If the spirometer includes an indicator to show your best effort, your nurse or respiratory therapist will set it to a desired goal.  If possible, sit up straight or lean slightly forward. Try not to slouch.  Hold the incentive spirometer in an upright position. INSTRUCTIONS FOR USE  1. Sit on the edge of your bed if possible, or sit up as far as you can in bed or on a chair. 2. Hold the incentive spirometer in an upright position. 3. Breathe out normally. 4. Place the mouthpiece in your mouth and seal your lips tightly around it. 5. Breathe in slowly and as deeply as possible, raising the piston or the ball toward the top of the column. 6. Hold your breath for 3-5 seconds or for as long as possible. Allow the piston or ball to fall to the bottom of the column. 7. Remove the mouthpiece from your mouth and breathe out normally. 8. Rest for a few seconds and repeat Steps 1 through 7 at least 10 times every 1-2 hours when you are awake. Take your time and take a few normal breaths between deep breaths. 9. The spirometer may include an indicator to show your best effort. Use the indicator as a goal  to work toward during each repetition. 10. After each set of 10 deep breaths, practice coughing to be sure your lungs are clear. If you have an incision (the cut made at the time of surgery), support your incision when coughing by placing a pillow or rolled up towels firmly against it. Once you are able to get out of bed, walk around indoors and cough well. You may stop using the incentive spirometer when instructed by your caregiver.  RISKS AND COMPLICATIONS  Take your time so you do not get dizzy or light-headed.  If you are in pain, you may need to take or ask for pain medication before doing incentive spirometry. It is harder to take a deep breath if you are having pain. AFTER USE  Rest and breathe slowly and easily.  It can be helpful to keep track of a log of your progress. Your caregiver can provide you with a simple table to help with this. If you are using the spirometer at home, follow  these instructions: SEEK MEDICAL CARE IF:   You are having difficultly using the spirometer.  You have trouble using the spirometer as often as instructed.  Your pain medication is not giving enough relief while using the spirometer.  You develop fever of 100.5 F (38.1 C) or higher. SEEK IMMEDIATE MEDICAL CARE IF:   You cough up bloody sputum that had not been present before.  You develop fever of 102 F (38.9 C) or greater.  You develop worsening pain at or near the incision site. MAKE SURE YOU:   Understand these instructions.  Will watch your condition.  Will get help right away if you are not doing well or get worse. Document Released: 03/01/2007 Document Revised: 01/11/2012 Document Reviewed: 05/02/2007 Exit[employer] Patient Information 2014 Exit[employer], [place].   ________________________________________________________________________  WHAT IS A BLOOD TRANSFUSION? Blood Transfusion Information  A transfusion is the replacement of blood or some of its parts. Blood is made up of  multiple cells which provide different functions.  Red blood cells carry oxygen and are used for blood loss replacement.  White blood cells fight against infection.  Platelets control bleeding.  Plasma helps clot blood.  Other blood products are available for specialized needs, such as hemophilia or other clotting disorders. BEFORE THE TRANSFUSION  Who gives blood for transfusions?   Healthy volunteers who are fully evaluated to make sure their blood is safe. This is blood bank blood. Transfusion therapy is the safest it has ever been in the practice of medicine. Before blood is taken from a donor, a complete history is taken to make sure that person has no history of diseases nor engages in risky social behavior (examples are intravenous drug use or sexual activity with multiple partners). The donor's travel history is screened to minimize risk of transmitting infections, such as malaria. The donated blood is tested for signs of infectious diseases, such as HIV and hepatitis. The blood is then tested to be sure it is compatible with you in order to minimize the chance of a transfusion reaction. If you or a relative donates blood, this is often done in anticipation of surgery and is not appropriate for emergency situations. It takes many days to process the donated blood. RISKS AND COMPLICATIONS Although transfusion therapy is very safe and saves many lives, the main dangers of transfusion include:   Getting an infectious disease.  Developing a transfusion reaction. This is an allergic reaction to something in the blood you were given. Every precaution is taken to prevent this. The decision to have a blood transfusion has been considered carefully by your caregiver before blood is given. Blood is not given unless the benefits outweigh the risks. AFTER THE TRANSFUSION  Right after receiving a blood transfusion, you will usually feel much better and more energetic. This is especially true if  your red blood cells have gotten low (anemic). The transfusion raises the level of the red blood cells which carry oxygen, and this usually causes an energy increase.  The nurse administering the transfusion will monitor you carefully for complications. HOME CARE INSTRUCTIONS  No special instructions are needed after a transfusion. You may find your energy is better. Speak with your caregiver about any limitations on activity for underlying diseases you may have. SEEK MEDICAL CARE IF:   Your condition is not improving after your transfusion.  You develop redness or irritation at the intravenous (IV) site. SEEK IMMEDIATE MEDICAL CARE IF:  Any of the following symptoms occur over the next 12 hours:  Shaking chills.  You have a temperature by mouth above 102 F (38.9 C), not controlled by medicine.  Chest, back, or muscle pain.  People around you feel you are not acting correctly or are confused.  Shortness of breath or difficulty breathing.  Dizziness and fainting.  You get a rash or develop hives.  You have a decrease in urine output.  Your urine turns a dark color or changes to pink, red, or brown. Any of the following symptoms occur over the next 10 days:  You have a temperature by mouth above 102 F (38.9 C), not controlled by medicine.  Shortness of breath.  Weakness after normal activity.  The white part of the eye turns yellow (jaundice).  You have a decrease in the amount of urine or are urinating less often.  Your urine turns a dark color or changes to pink, red, or brown. Document Released: 10/16/2000 Document Revised: 01/11/2012 Document Reviewed: 06/04/2008 [facility] Patient Information 2014 [place], [place].  _______________________________________________________________________

## 2018-10-20 ENCOUNTER — Encounter (HOSPITAL_COMMUNITY)
Admission: RE | Admit: 2018-10-20 | Discharge: 2018-10-20 | Disposition: A | Discharge status: Home / Self Care | Payer: Medicare Other | Source: Ambulatory Visit | Attending: Gynecologic Oncology | Admitting: Gynecologic Oncology

## 2018-10-20 ENCOUNTER — Other Ambulatory Visit: Payer: Self-pay

## 2018-10-20 ENCOUNTER — Encounter (HOSPITAL_COMMUNITY): Payer: Self-pay

## 2018-10-20 DIAGNOSIS — Z833 Family history of diabetes mellitus: Secondary | ICD-10-CM | POA: Diagnosis not present

## 2018-10-20 DIAGNOSIS — Z01818 Encounter for other preprocedural examination: Secondary | ICD-10-CM | POA: Insufficient documentation

## 2018-10-20 DIAGNOSIS — Z7984 Long term (current) use of oral hypoglycemic drugs: Secondary | ICD-10-CM | POA: Diagnosis not present

## 2018-10-20 DIAGNOSIS — Z79899 Other long term (current) drug therapy: Secondary | ICD-10-CM | POA: Insufficient documentation

## 2018-10-20 DIAGNOSIS — I10 Essential (primary) hypertension: Secondary | ICD-10-CM | POA: Diagnosis not present

## 2018-10-20 DIAGNOSIS — N838 Other noninflammatory disorders of ovary, fallopian tube and broad ligament: Secondary | ICD-10-CM | POA: Insufficient documentation

## 2018-10-20 DIAGNOSIS — R9431 Abnormal electrocardiogram [ECG] [EKG]: Secondary | ICD-10-CM | POA: Diagnosis not present

## 2018-10-20 DIAGNOSIS — E119 Type 2 diabetes mellitus without complications: Secondary | ICD-10-CM | POA: Diagnosis not present

## 2018-10-20 DIAGNOSIS — Z87891 Personal history of nicotine dependence: Secondary | ICD-10-CM | POA: Insufficient documentation

## 2018-10-20 LAB — CBC
HEMATOCRIT: 38.8 % (ref 36.0–46.0)
Hemoglobin: 12.1 g/dL (ref 12.0–15.0)
MCH: 28 pg (ref 26.0–34.0)
MCHC: 31.2 g/dL (ref 30.0–36.0)
MCV: 89.8 fL (ref 80.0–100.0)
Platelets: 230 10*3/uL (ref 150–400)
RBC: 4.32 MIL/uL (ref 3.87–5.11)
RDW: 14.7 % (ref 11.5–15.5)
WBC: 5.9 10*3/uL (ref 4.0–10.5)
nRBC: 0 % (ref 0.0–0.2)

## 2018-10-20 LAB — URINALYSIS, ROUTINE W REFLEX MICROSCOPIC
Bacteria, UA: NONE SEEN
Bilirubin Urine: NEGATIVE
Glucose, UA: NEGATIVE mg/dL
HGB URINE DIPSTICK: NEGATIVE
Ketones, ur: NEGATIVE mg/dL
Nitrite: NEGATIVE
Protein, ur: NEGATIVE mg/dL
Specific Gravity, Urine: 1.024 (ref 1.005–1.030)
pH: 5 (ref 5.0–8.0)

## 2018-10-20 LAB — HEMOGLOBIN A1C
Hgb A1c MFr Bld: 5.6 % (ref 4.8–5.6)
Mean Plasma Glucose: 114.02 mg/dL

## 2018-10-20 LAB — COMPREHENSIVE METABOLIC PANEL
ALT: 9 U/L (ref 0–44)
AST: 17 U/L (ref 15–41)
Albumin: 4.5 g/dL (ref 3.5–5.0)
Alkaline Phosphatase: 98 U/L (ref 38–126)
Anion gap: 8 (ref 5–15)
BILIRUBIN TOTAL: 0.6 mg/dL (ref 0.3–1.2)
BUN: 14 mg/dL (ref 8–23)
CO2: 25 mmol/L (ref 22–32)
Calcium: 9.8 mg/dL (ref 8.9–10.3)
Chloride: 109 mmol/L (ref 98–111)
Creatinine, Ser: 0.46 mg/dL (ref 0.44–1.00)
GFR calc Af Amer: >60 mL/min (ref >60)
Glucose, Bld: 104 mg/dL — ABNORMAL HIGH (ref 70–99)
Potassium: 4.7 mmol/L (ref 3.5–5.1)
Sodium: 142 mmol/L (ref 135–145)
Total Protein: 6.6 g/dL (ref 6.5–8.1)

## 2018-10-20 LAB — GLUCOSE, CAPILLARY: GLUCOSE-CAPILLARY: 130 mg/dL — AB (ref 70–99)

## 2018-10-20 NOTE — Progress Notes (Addendum)
EKG results reviewed with Dr. Ola Spurr, Anesthesiologist. Per Dr. Ola Spurr, if pt is asymptomatic, okay to proceed with surgery.Marland KitchenMarland KitchenPt was asymptomatic during PAT assessment.  10-20-18 UA result routed to Dr. Denman George for review

## 2018-10-21 LAB — CEA: CEA: 3.3 ng/mL (ref 0.0–4.7)

## 2018-10-21 LAB — ABO/RH: ABO/RH(D): B POS

## 2018-10-22 LAB — URINE CULTURE: Culture: <10000 — AB

## 2018-10-24 ENCOUNTER — Ambulatory Visit (HOSPITAL_BASED_OUTPATIENT_CLINIC_OR_DEPARTMENT_OTHER): Payer: Medicare Other | Admitting: Anesthesiology

## 2018-10-24 ENCOUNTER — Encounter (HOSPITAL_COMMUNITY)
Admission: RE | Disposition: A | Discharge status: Home / Self Care | Payer: Self-pay | Source: Home / Self Care | Attending: Gynecologic Oncology

## 2018-10-24 ENCOUNTER — Ambulatory Visit (HOSPITAL_BASED_OUTPATIENT_CLINIC_OR_DEPARTMENT_OTHER)
Admission: RE | Admit: 2018-10-24 | Discharge: 2018-10-24 | Disposition: A | Discharge status: Home / Self Care | Payer: Medicare Other | Attending: Gynecologic Oncology | Admitting: Gynecologic Oncology

## 2018-10-24 ENCOUNTER — Encounter (HOSPITAL_COMMUNITY): Payer: Self-pay | Admitting: Certified Registered"

## 2018-10-24 DIAGNOSIS — Z8249 Family history of ischemic heart disease and other diseases of the circulatory system: Secondary | ICD-10-CM | POA: Insufficient documentation

## 2018-10-24 DIAGNOSIS — Z7984 Long term (current) use of oral hypoglycemic drugs: Secondary | ICD-10-CM | POA: Diagnosis not present

## 2018-10-24 DIAGNOSIS — Z833 Family history of diabetes mellitus: Secondary | ICD-10-CM | POA: Insufficient documentation

## 2018-10-24 DIAGNOSIS — R1900 Intra-abdominal and pelvic swelling, mass and lump, unspecified site: Secondary | ICD-10-CM | POA: Diagnosis present

## 2018-10-24 DIAGNOSIS — N838 Other noninflammatory disorders of ovary, fallopian tube and broad ligament: Secondary | ICD-10-CM | POA: Diagnosis not present

## 2018-10-24 DIAGNOSIS — Z90710 Acquired absence of both cervix and uterus: Secondary | ICD-10-CM | POA: Insufficient documentation

## 2018-10-24 DIAGNOSIS — E119 Type 2 diabetes mellitus without complications: Secondary | ICD-10-CM | POA: Diagnosis not present

## 2018-10-24 DIAGNOSIS — Z87891 Personal history of nicotine dependence: Secondary | ICD-10-CM | POA: Diagnosis not present

## 2018-10-24 DIAGNOSIS — I10 Essential (primary) hypertension: Secondary | ICD-10-CM | POA: Diagnosis not present

## 2018-10-24 DIAGNOSIS — Z79899 Other long term (current) drug therapy: Secondary | ICD-10-CM | POA: Insufficient documentation

## 2018-10-24 DIAGNOSIS — N83202 Unspecified ovarian cyst, left side: Secondary | ICD-10-CM

## 2018-10-24 DIAGNOSIS — D271 Benign neoplasm of left ovary: Secondary | ICD-10-CM | POA: Diagnosis not present

## 2018-10-24 DIAGNOSIS — R8569 Abnormal cytological findings in specimens from other digestive organs and abdominal cavity: Secondary | ICD-10-CM | POA: Diagnosis not present

## 2018-10-24 HISTORY — PX: ROBOTIC ASSISTED SALPINGO OOPHERECTOMY: SHX6082

## 2018-10-24 LAB — TYPE AND SCREEN
ABO/RH(D): B POS
ANTIBODY SCREEN: NEGATIVE

## 2018-10-24 LAB — GLUCOSE, CAPILLARY: Glucose-Capillary: 95 mg/dL (ref 70–99)

## 2018-10-24 SURGERY — SALPINGO-OOPHORECTOMY, ROBOT-ASSISTED
Anesthesia: General | Laterality: Bilateral

## 2018-10-24 MED ORDER — PROMETHAZINE HCL 25 MG/ML IJ SOLN
6.2500 mg | INTRAMUSCULAR | Status: DC | PRN
  Administered 2018-10-24: 6.25 mg via INTRAVENOUS

## 2018-10-24 MED ORDER — TRAMADOL HCL 50 MG PO TABS: 50.0000 mg | ORAL_TABLET | Freq: Four times a day (QID) | ORAL | 0 refills | Status: AC | PRN

## 2018-10-24 MED ORDER — SCOPOLAMINE 1 MG/3DAYS TD PT72
1.0000 | MEDICATED_PATCH | TRANSDERMAL | Status: DC
  Administered 2018-10-24: 1.5 mg via TRANSDERMAL
  Filled 2018-10-24: qty 1

## 2018-10-24 MED ORDER — ACETAMINOPHEN 500 MG PO TABS
1000.0000 mg | ORAL_TABLET | ORAL | Status: AC
  Administered 2018-10-24: 1000 mg via ORAL
  Filled 2018-10-24: qty 2

## 2018-10-24 MED ORDER — CEFAZOLIN SODIUM-DEXTROSE 2-4 GM/100ML-% IV SOLN
2.0000 g | INTRAVENOUS | Status: AC
  Administered 2018-10-24: 2 g via INTRAVENOUS
  Filled 2018-10-24: qty 100

## 2018-10-24 MED ORDER — DEXAMETHASONE SODIUM PHOSPHATE 10 MG/ML IJ SOLN
INTRAMUSCULAR | Status: DC | PRN
  Administered 2018-10-24: 5 mg via INTRAVENOUS

## 2018-10-24 MED ORDER — LIDOCAINE 2% (20 MG/ML) 5 ML SYRINGE
INTRAMUSCULAR | Status: AC
  Filled 2018-10-24: qty 5

## 2018-10-24 MED ORDER — PROPOFOL 10 MG/ML IV BOLUS
INTRAVENOUS | Status: DC | PRN
  Administered 2018-10-24: 120 mg via INTRAVENOUS

## 2018-10-24 MED ORDER — PHENYLEPHRINE HCL 10 MG/ML IJ SOLN
INTRAMUSCULAR | Status: DC | PRN
  Administered 2018-10-24 (×2): 120 ug via INTRAVENOUS

## 2018-10-24 MED ORDER — ARTIFICIAL TEARS OPHTHALMIC OINT
TOPICAL_OINTMENT | OPHTHALMIC | Status: AC
  Filled 2018-10-24: qty 3.5

## 2018-10-24 MED ORDER — FENTANYL CITRATE (PF) 100 MCG/2ML IJ SOLN
25.0000 ug | INTRAMUSCULAR | Status: DC | PRN
  Administered 2018-10-24: 50 ug via INTRAVENOUS
  Administered 2018-10-24 (×2): 25 ug via INTRAVENOUS

## 2018-10-24 MED ORDER — KETOROLAC TROMETHAMINE 30 MG/ML IJ SOLN
INTRAMUSCULAR | Status: AC
  Filled 2018-10-24: qty 1

## 2018-10-24 MED ORDER — BUPIVACAINE HCL (PF) 0.25 % IJ SOLN
INTRAMUSCULAR | Status: DC | PRN
  Administered 2018-10-24: 25 mL

## 2018-10-24 MED ORDER — ONDANSETRON HCL 4 MG/2ML IJ SOLN
INTRAMUSCULAR | Status: DC | PRN
  Administered 2018-10-24: 4 mg via INTRAVENOUS

## 2018-10-24 MED ORDER — FENTANYL CITRATE (PF) 100 MCG/2ML IJ SOLN
INTRAMUSCULAR | Status: DC | PRN
  Administered 2018-10-24: 75 ug via INTRAVENOUS
  Administered 2018-10-24 (×3): 25 ug via INTRAVENOUS

## 2018-10-24 MED ORDER — KETOROLAC TROMETHAMINE 30 MG/ML IJ SOLN
INTRAMUSCULAR | Status: DC | PRN
  Administered 2018-10-24: 15 mg via INTRAVENOUS

## 2018-10-24 MED ORDER — OXYCODONE HCL 5 MG/5ML PO SOLN
5.0000 mg | Freq: Once | ORAL | Status: DC | PRN
  Filled 2018-10-24: qty 5

## 2018-10-24 MED ORDER — GABAPENTIN 300 MG PO CAPS
300.0000 mg | ORAL_CAPSULE | ORAL | Status: AC
  Administered 2018-10-24: 300 mg via ORAL
  Filled 2018-10-24: qty 1

## 2018-10-24 MED ORDER — PROPOFOL 10 MG/ML IV BOLUS
INTRAVENOUS | Status: AC
  Filled 2018-10-24: qty 20

## 2018-10-24 MED ORDER — ROCURONIUM BROMIDE 10 MG/ML (PF) SYRINGE
PREFILLED_SYRINGE | INTRAVENOUS | Status: AC
  Filled 2018-10-24: qty 10

## 2018-10-24 MED ORDER — LACTATED RINGERS IV SOLN
INTRAVENOUS | Status: DC
  Administered 2018-10-24: 1000 mL via INTRAVENOUS

## 2018-10-24 MED ORDER — SODIUM CHLORIDE 0.9 % IR SOLN
Status: DC | PRN
  Administered 2018-10-24: 1000 mL

## 2018-10-24 MED ORDER — ROCURONIUM BROMIDE 10 MG/ML (PF) SYRINGE
PREFILLED_SYRINGE | INTRAVENOUS | Status: DC | PRN
  Administered 2018-10-24: 50 mg via INTRAVENOUS

## 2018-10-24 MED ORDER — OXYCODONE HCL 5 MG PO TABS: 5.0000 mg | ORAL_TABLET | Freq: Once | ORAL | Status: DC | PRN

## 2018-10-24 MED ORDER — FENTANYL CITRATE (PF) 100 MCG/2ML IJ SOLN
INTRAMUSCULAR | Status: AC
  Filled 2018-10-24: qty 2

## 2018-10-24 MED ORDER — LIDOCAINE 2% (20 MG/ML) 5 ML SYRINGE
INTRAMUSCULAR | Status: DC | PRN
  Administered 2018-10-24: 60 mg via INTRAVENOUS

## 2018-10-24 MED ORDER — PROMETHAZINE HCL 25 MG/ML IJ SOLN
INTRAMUSCULAR | Status: AC
  Filled 2018-10-24: qty 1

## 2018-10-24 MED ORDER — FENTANYL CITRATE (PF) 250 MCG/5ML IJ SOLN
INTRAMUSCULAR | Status: AC
  Filled 2018-10-24: qty 5

## 2018-10-24 MED ORDER — DEXAMETHASONE SODIUM PHOSPHATE 4 MG/ML IJ SOLN: 4.0000 mg | INTRAMUSCULAR | Status: DC

## 2018-10-24 MED ORDER — ACETAMINOPHEN 10 MG/ML IV SOLN: 1000.0000 mg | Freq: Once | INTRAVENOUS | Status: DC | PRN

## 2018-10-24 MED ORDER — DEXAMETHASONE SODIUM PHOSPHATE 10 MG/ML IJ SOLN
INTRAMUSCULAR | Status: AC
  Filled 2018-10-24: qty 1

## 2018-10-24 MED ORDER — PHENYLEPHRINE 40 MCG/ML (10ML) SYRINGE FOR IV PUSH (FOR BLOOD PRESSURE SUPPORT)
PREFILLED_SYRINGE | INTRAVENOUS | Status: AC
  Filled 2018-10-24: qty 10

## 2018-10-24 MED ORDER — METOPROLOL TARTRATE 5 MG/5ML IV SOLN
INTRAVENOUS | Status: DC | PRN
  Administered 2018-10-24 (×2): 2.5 mg via INTRAVENOUS

## 2018-10-24 MED ORDER — ONDANSETRON HCL 4 MG/2ML IJ SOLN
INTRAMUSCULAR | Status: AC
  Filled 2018-10-24: qty 2

## 2018-10-24 MED ORDER — METOPROLOL TARTRATE 5 MG/5ML IV SOLN
INTRAVENOUS | Status: AC
  Filled 2018-10-24: qty 5

## 2018-10-24 MED ORDER — SUGAMMADEX SODIUM 200 MG/2ML IV SOLN
INTRAVENOUS | Status: DC | PRN
  Administered 2018-10-24: 110 mg via INTRAVENOUS

## 2018-10-24 SURGICAL SUPPLY — 54 items
ADH SKN CLS APL DERMABOND .7 (GAUZE/BANDAGES/DRESSINGS) ×1
AGENT HMST KT MTR STRL THRMB (HEMOSTASIS)
APL ESCP 34 STRL LF DISP (HEMOSTASIS)
APPLICATOR SURGIFLO ENDO (HEMOSTASIS) IMPLANT
BAG LAPAROSCOPIC 12 15 PORT 16 (BASKET) IMPLANT
BAG RETRIEVAL 12/15 (BASKET)
BAG SPEC RTRVL LRG 6X4 10 (ENDOMECHANICALS) ×3
COVER BACK TABLE 60X90IN (DRAPES) ×2 IMPLANT
COVER TIP SHEARS 8 DVNC (MISCELLANEOUS) ×1 IMPLANT
COVER TIP SHEARS 8MM DA VINCI (MISCELLANEOUS) ×1
COVER WAND RF STERILE (DRAPES) IMPLANT
DERMABOND ADVANCED (GAUZE/BANDAGES/DRESSINGS) ×1
DERMABOND ADVANCED .7 DNX12 (GAUZE/BANDAGES/DRESSINGS) ×1 IMPLANT
DRAPE ARM DVNC X/XI (DISPOSABLE) ×4 IMPLANT
DRAPE COLUMN DVNC XI (DISPOSABLE) ×1 IMPLANT
DRAPE DA VINCI XI ARM (DISPOSABLE) ×4
DRAPE DA VINCI XI COLUMN (DISPOSABLE) ×1
DRAPE SHEET LG 3/4 BI-LAMINATE (DRAPES) ×2 IMPLANT
DRAPE SURG IRRIG POUCH 19X23 (DRAPES) ×2 IMPLANT
ELECT REM PT RETURN 9FT ADLT (ELECTROSURGICAL) ×2
ELECTRODE REM PT RTRN 9FT ADLT (ELECTROSURGICAL) ×1 IMPLANT
GLOVE BIO SURGEON STRL SZ 6 (GLOVE) ×8 IMPLANT
GLOVE BIO SURGEON STRL SZ 6.5 (GLOVE) IMPLANT
GOWN STRL REUS W/ TWL LRG LVL3 (GOWN DISPOSABLE) ×5 IMPLANT
GOWN STRL REUS W/TWL LRG LVL3 (GOWN DISPOSABLE) ×10
HOLDER FOLEY CATH W/STRAP (MISCELLANEOUS) ×2 IMPLANT
IRRIG SUCT STRYKERFLOW 2 WTIP (MISCELLANEOUS) ×2
IRRIGATION SUCT STRKRFLW 2 WTP (MISCELLANEOUS) ×1 IMPLANT
KIT PROCEDURE DA VINCI SI (MISCELLANEOUS)
KIT PROCEDURE DVNC SI (MISCELLANEOUS) IMPLANT
MANIPULATOR UTERINE 4.5 ZUMI (MISCELLANEOUS) ×1 IMPLANT
NDL SPNL 18GX3.5 QUINCKE PK (NEEDLE) IMPLANT
NEEDLE SPNL 18GX3.5 QUINCKE PK (NEEDLE) IMPLANT
OBTURATOR OPTICAL STANDARD 8MM (TROCAR) ×1
OBTURATOR OPTICAL STND 8 DVNC (TROCAR) ×1
OBTURATOR OPTICALSTD 8 DVNC (TROCAR) ×1 IMPLANT
PACK ROBOT GYN CUSTOM WL (TRAY / TRAY PROCEDURE) ×2 IMPLANT
PAD POSITIONING PINK XL (MISCELLANEOUS) ×2 IMPLANT
PORT ACCESS TROCAR AIRSEAL 12 (TROCAR) ×1 IMPLANT
PORT ACCESS TROCAR AIRSEAL 5M (TROCAR) ×1
POUCH SPECIMEN RETRIEVAL 10MM (ENDOMECHANICALS) ×3 IMPLANT
SEAL CANN UNIV 5-8 DVNC XI (MISCELLANEOUS) ×4 IMPLANT
SEAL XI 5MM-8MM UNIVERSAL (MISCELLANEOUS) ×4
SET TRI-LUMEN FLTR TB AIRSEAL (TUBING) ×2 IMPLANT
SURGIFLO W/THROMBIN 8M KIT (HEMOSTASIS) IMPLANT
SUT MNCRL AB 4-0 PS2 18 (SUTURE) ×4 IMPLANT
SUT VIC AB 0 CT1 27 (SUTURE)
SUT VIC AB 0 CT1 27XBRD ANTBC (SUTURE) ×1 IMPLANT
SYR 10ML LL (SYRINGE) IMPLANT
TOWEL OR NON WOVEN STRL DISP B (DISPOSABLE) ×2 IMPLANT
TRAP SPECIMEN MUCOUS 40CC (MISCELLANEOUS) ×1 IMPLANT
TRAY FOLEY MTR SLVR 16FR STAT (SET/KITS/TRAYS/PACK) ×2 IMPLANT
UNDERPAD 30X30 (UNDERPADS AND DIAPERS) ×2 IMPLANT
WATER STERILE IRR 1000ML POUR (IV SOLUTION) ×2 IMPLANT

## 2018-10-24 NOTE — Anesthesia Procedure Notes (Signed)
Procedure Name: Intubation Date/Time: 10/24/2018 12:27 PM Performed by: Suan Halter, CRNA Pre-anesthesia Checklist: Patient identified, Emergency Drugs available, Suction available and Patient being monitored Patient Re-evaluated:Patient Re-evaluated prior to induction Oxygen Delivery Method: Circle system utilized Preoxygenation: Pre-oxygenation with 100% oxygen Induction Type: IV induction Ventilation: Mask ventilation without difficulty Laryngoscope Size: Mac and 3 Grade View: Grade I Tube type: Oral Tube size: 7.0 mm Number of attempts: 1 Airway Equipment and Method: Stylet and Oral airway Placement Confirmation: ETT inserted through vocal cords under direct vision,  positive ETCO2 and breath sounds checked- equal and bilateral Secured at: 22 cm Tube secured with: Tape Dental Injury: Teeth and Oropharynx as per pre-operative assessment

## 2018-10-24 NOTE — Anesthesia Preprocedure Evaluation (Signed)
Anesthesia Evaluation  Patient identified by MRN, date of birth, ID band Patient awake    Reviewed: Allergy & Precautions, NPO status , Patient's Chart, lab work & pertinent test results  Airway Mallampati: II  TM Distance: >3 FB Neck ROM: Full    Dental no notable dental hx.    Pulmonary neg pulmonary ROS, former smoker,    Pulmonary exam normal breath sounds clear to auscultation       Cardiovascular hypertension, Pt. on medications and Pt. on home beta blockers Normal cardiovascular exam Rhythm:Regular Rate:Normal     Neuro/Psych negative neurological ROS  negative psych ROS   GI/Hepatic negative GI ROS, Neg liver ROS,   Endo/Other  diabetes  Renal/GU negative Renal ROS  negative genitourinary   Musculoskeletal negative musculoskeletal ROS (+)   Abdominal   Peds negative pediatric ROS (+)  Hematology negative hematology ROS (+)   Anesthesia Other Findings   Reproductive/Obstetrics negative OB ROS                             Anesthesia Physical Anesthesia Plan  ASA: II  Anesthesia Plan: General   Post-op Pain Management:    Induction: Intravenous  PONV Risk Score and Plan: 3 and Ondansetron, Dexamethasone and Treatment may vary due to age or medical condition  Airway Management Planned: Oral ETT  Additional Equipment:   Intra-op Plan:   Post-operative Plan: Extubation in OR  Informed Consent: I have reviewed the patients History and Physical, chart, labs and discussed the procedure including the risks, benefits and alternatives for the proposed anesthesia with the patient or authorized representative who has indicated his/her understanding and acceptance.   Dental advisory given  Plan Discussed with: CRNA and Surgeon  Anesthesia Plan Comments:         Anesthesia Quick Evaluation

## 2018-10-24 NOTE — Discharge Instructions (Signed)
10/24/2018  Return to work: as discussed with Dr. Denman George  Activity: 1. Be up and out of the bed during the day.  Take a nap if needed.  You may walk up steps but be careful and use the hand rail.  Stair climbing will tire you more than you think, you may need to stop part way and rest.   2. No lifting or straining for 6 weeks.  3. No driving for 1 weeks.  Do Not drive if you are taking narcotic pain medicine.  4. Shower daily.  Use soap and water on your incision and pat dry; don't rub.   5. No sexual activity and nothing in the vagina for 4 weeks.  Medications:  - Take ibuprofen and tylenol first line for pain control. Take these regularly (every 6 hours) to decrease the build up of pain.  - If necessary, for severe pain not relieved by ibuprofen, take percocet.  - While taking percocet you should take sennakot every night to reduce the likelihood of constipation. If this causes diarrhea, stop its use.  Diet: 1. Low sodium Heart Healthy Diet is recommended.  2. It is safe to use a laxative if you have difficulty moving your bowels.   Wound Care: 1. Keep clean and dry.  Shower daily.  Reasons to call the Doctor:   Fever - Oral temperature greater than 100.4 degrees Fahrenheit  Foul-smelling vaginal discharge  Difficulty urinating  Nausea and vomiting  Increased pain at the site of the incision that is unrelieved with pain medicine.  Difficulty breathing with or without chest pain  New calf pain especially if only on one side  Sudden, continuing increased vaginal bleeding with or without clots.   Follow-up: 1. See Everitt Amber in 2-3 weeks.  Contacts: For questions or concerns you should contact:  Dr. Everitt Amber at [phone] After hours and on week-ends call [phone] and ask to speak to the physician on call for Gynecologic Oncology

## 2018-10-24 NOTE — Op Note (Signed)
OPERATIVE NOTE  Date: 10/24/18  Preoperative Diagnosis: left ovarian mass   Postoperative Diagnosis:  same  Procedure(s) Performed: Robotic-assisted laparoscopic bilateral salpingo-oophorectomy  Surgeon: Everitt Amber, M.D.  Assistant Surgeon: Precious Haws M.D. (an MD assistant was necessary for tissue manipulation, management of robotic instrumentation, retraction and positioning due to the complexity of the case and hospital policies).   Anesthesia: Gen. endotracheal.  Specimens: Bilateral ovaries, fallopian tubes, pelvic washings  Estimated Blood Loss: <20 mL. Blood Replacement: None  Complications: none  Indication for Procedure:  Solid 3cm left ovarian cyst, s/p hysterectomy.  Operative Findings: surgically absent uterus, normal appearing right ovary, solid left ovarian mass with ovary measuring 5cm.   [place] pathology was consistent with benign fibroma  Procedure: The patient's taken to the operating room and placed under general endotracheal anesthesia testing difficulty. She is placed in a dorsolithotomy position and cervical acromial pad was placed. The arms were tucked with care taken to pad the olecranon process. And prepped and draped in usual sterile fashion. A uterine manipulator (zumi) was placed vaginally. A 85mm incision was made in the left upper quadrant palmer's point and a 5 mm Optiview trocar used to enter the abdomen under direct visualization. With entry into the abdomen and then maintenance of 15 mm of mercury the patient was placed in Trendelenburg position. An incision was made in the umbilicus and a 81OF trochar was placed through this site. Two incisions were made lateral to the umbilical incision in the left and right abdomen measuring 24mm. These incisions were made approximately 10 cm lateral to the umbilical incision. 8 mm robotic trochars were inserted. The robot was docked.  The abdomen was inspected as was the pelvis.  Pelvic washings were obtained. An  incision was made on the right pelvic side wall peritoneum parallel to the IP ligament and the retroperitoneal space entered. The right ureter was identified and the para-rectal space was developed. A window was created in the right broad ligament above the ureter. The right ovarian vessels were skeletonized cauterized and transected. The right attachments between ovary and vagina were similarly were cauterized and transected. Specimen was placed in an Endo Catch bag.  In a similar manner the left peritoneum and the side wall was incised, and the retroperitoneal space entered. The left ureter was identified and the left pararectal space was developed. The ovarian vessels ligament was skeletonized cauterized and transected. The left residual attachments to the vaginal cuff were cauterized and transected in the left adnexa was placed in an Endo Catch bag.  The abdomen was copiously irrigated and drained and all operative sites inspected and hemostasis was assured  The robot was undocked. The camera was placed through the left upper abdominal incision. The contents of the left Endo Catch bag were first aspirated and then morcellated to facilitate removal from the abdominal cavity through the umbilical incision. In a similar fashion the contents of the right Endo Catch bag or morcellated to facilitate removal from the abdominal cavity.  The ports were all remove. The fascial closure at the umbilical incision and left upper quadrant port was made with 0 Vicryl.  All incisions were closed with a running subcuticular Monocryl suture. Dermabond was applied. Sponge, lap and needle counts were correct x 3.    The patient had sequential compression devices for VTE prophylaxis.         Disposition: PACU          Condition: stable  Donaciano Eva, MD

## 2018-10-24 NOTE — Transfer of Care (Signed)
Immediate Anesthesia Transfer of Care Note  Patient: Trishelle Reisz  Procedure(s) Performed: Procedure(s) (LRB): XI ROBOTIC ASSISTED BILATERAL SALPINGO OOPHORECTOMY (Bilateral)  Patient Location: PACU  Anesthesia Type: General  Level of Consciousness: awake, oriented, sedated and patient cooperative  Airway & Oxygen Therapy: Patient Spontanous Breathing and Patient connected to face mask oxygen  Post-op Assessment: Report given to PACU RN and Post -op Vital signs reviewed and stable  Post vital signs: Reviewed and stable  Complications: No apparent anesthesia complications  Last Vitals:  Vitals Value Taken Time  BP 150/86 10/24/2018  1:51 PM  Temp    Pulse 72 10/24/2018  1:59 PM  Resp 13 10/24/2018  1:59 PM  SpO2 100 % 10/24/2018  1:59 PM  Vitals shown include unvalidated device data.  Last Pain:  Vitals:   10/24/18 1134  TempSrc:   PainSc: 0-No pain      Patients Stated Pain Goal: 4 (10/24/18 1134)

## 2018-10-24 NOTE — Interval H&P Note (Signed)
History and Physical Interval Note:  10/24/2018 11:35 AM  Darlene Jones  has presented today for surgery, with the diagnosis of OVARIAN MASS  The various methods of treatment have been discussed with the patient and family. After consideration of risks, benefits and other options for treatment, the patient has consented to  Procedure(s): XI ROBOTIC ASSISTED BILATERAL SALPINGO OOPHORECTOMY AND POSSIBLE STAGING (Bilateral) as a surgical intervention .  The patient's history has been reviewed, patient examined, no change in status, stable for surgery.  I have reviewed the patient's chart and labs.  Questions were answered to the patient's satisfaction.     Thereasa Solo

## 2018-10-25 NOTE — Anesthesia Postprocedure Evaluation (Signed)
Anesthesia Post Note  Patient: Tax inspector  Procedure(s) Performed: XI ROBOTIC ASSISTED BILATERAL SALPINGO OOPHORECTOMY (Bilateral )     Patient location during evaluation: PACU Anesthesia Type: General Level of consciousness: awake and alert Pain management: pain level controlled Vital Signs Assessment: post-procedure vital signs reviewed and stable Respiratory status: spontaneous breathing, nonlabored ventilation, respiratory function stable and patient connected to nasal cannula oxygen Cardiovascular status: blood pressure returned to baseline and stable Postop Assessment: no apparent nausea or vomiting Anesthetic complications: no    Last Vitals:  Vitals:   10/24/18 1630 10/24/18 1658  BP: (!) 144/81 (!) 142/80  Pulse: 91   Resp: 16 16  Temp: 36.5 C   SpO2: 96% 98%    Last Pain:  Vitals:   10/24/18 1658  TempSrc:   PainSc: 0-No pain                 Sumie Remsen S

## 2018-10-28 ENCOUNTER — Encounter (HOSPITAL_BASED_OUTPATIENT_CLINIC_OR_DEPARTMENT_OTHER): Payer: Self-pay | Admitting: Gynecologic Oncology

## 2018-10-31 ENCOUNTER — Telehealth: Payer: Self-pay

## 2018-10-31 NOTE — Telephone Encounter (Signed)
Outgoing call to pt regarding recent surg path results per Joylene John NP as " no cancer on final path, how is she doing"  - pt voiced understanding of results.  She reports she is presently out to eat-breakfast and she verbalized is she doing well, eating fine, going to bathroom fine, and no fever or concerns.  Confirmed she has f/u appt next week.  No other needs per pt at this time.

## 2018-11-14 DIAGNOSIS — F419 Anxiety disorder, unspecified: Secondary | ICD-10-CM | POA: Diagnosis not present

## 2018-11-14 DIAGNOSIS — E785 Hyperlipidemia, unspecified: Secondary | ICD-10-CM | POA: Diagnosis not present

## 2018-11-14 DIAGNOSIS — E119 Type 2 diabetes mellitus without complications: Secondary | ICD-10-CM | POA: Diagnosis not present

## 2018-11-14 DIAGNOSIS — I10 Essential (primary) hypertension: Secondary | ICD-10-CM | POA: Diagnosis not present

## 2018-11-16 ENCOUNTER — Encounter: Payer: Self-pay | Admitting: Gynecologic Oncology

## 2018-11-16 ENCOUNTER — Inpatient Hospital Stay: Payer: Medicare Other | Attending: Obstetrics | Admitting: Gynecologic Oncology

## 2018-11-16 VITALS — BP 135/66 | HR 74 | Temp 98.4°F | Resp 16 | Ht 59.0 in | Wt 119.0 lb

## 2018-11-16 DIAGNOSIS — Z90722 Acquired absence of ovaries, bilateral: Secondary | ICD-10-CM | POA: Insufficient documentation

## 2018-11-16 DIAGNOSIS — N83202 Unspecified ovarian cyst, left side: Secondary | ICD-10-CM | POA: Diagnosis not present

## 2018-11-16 DIAGNOSIS — N9489 Other specified conditions associated with female genital organs and menstrual cycle: Secondary | ICD-10-CM

## 2018-11-16 NOTE — Patient Instructions (Signed)
Dr Denman George will follow-up with the billing department regarding the codes that were used with your surgery to determine why coverage was denied.  Dr Denman George recommends that you follow-up with Dr Landry Mellow annually for wellness visits.

## 2018-11-16 NOTE — Progress Notes (Signed)
[facility] was initially requested by Dr. Christophe Louis for an adnexal mass (h/o hysterectomy)   Chief Complaint  Patient presents with  . Adnexal mass    GYN Oncologic Summary 1. TBD o .  HPI: [name]  is a very nice 78 y.o.  P1  She went to see a GI provider due to 2 months of diarrhea and weight loss. A CTScan was ordered and a pelvic mass seen. (3.1 cm left adnexal mass, which could represent a complex cyst or solid mass)   She was referred onto Dr. Christophe Louis who performed a TVUS and ordered CA125. The CA125 was normal. Per the patient because the ultrasound was inconclusive she was sent for a pelvic MRI. (See below)  The TVUS 09/23/18 revealed an enlarged (nonvascular) left ovary with notation the images were poor. The left adnexa was estimated to measure 2.4x2.8x1.9cm.  The MRI 10/10/18 - Left ovary: A homogeneous solid enhancing mass is seen which shows diffuse T2 hypointensity and measures 3.0 x 2.3 cm. Differential diagnosis includes ovarian fibrothecoma and broad ligament fibroid. Other: No peritoneal thickening or abnormal free fluid.  NO LAD  CA125 normal  09/14/18 = 17.4  States the thrombocytopenia resolved. I see in 2015 she had 110's on her platelet, but normal in 2017  Interval Hx:  On 10/24/18 she undewent robotic assisted BSO. Surgery was uncomplicated. 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  She has done well postop with no issues.   Imported EPIC Oncologic History:   No history exists.    Measurement of disease: TBD Radiology: Mr Pelvis W Wo Contrast  Result Date: 10/10/2018 CLINICAL DATA:  20 lb weight loss in past 3 months. Left adnexal mass on recent CT. Creatinine was obtained on site at [facility] at [address] Results: Creatinine 0.5 mg/dL. EXAM: MRI PELVIS WITHOUT AND WITH CONTRAST TECHNIQUE: Multiplanar multisequence  MR imaging of the pelvis was performed both before and after administration of intravenous contrast. CONTRAST:  67mL MULTIHANCE GADOBENATE DIMEGLUMINE 529 MG/ML IV SOLN COMPARISON:  CT on 08/29/2018 FINDINGS: Urinary Tract: No urinary bladder or urethral abnormality. Bowel: Unremarkable pelvic bowel loops. Vascular/Lymphatic: Unremarkable. No pathologically enlarged pelvic lymph nodes identified. Reproductive: -- Uterus:  Surgically absent. -- Right ovary: Not visualized, however no definite adnexal mass identified. -- Left ovary: A homogeneous solid enhancing mass is seen which shows diffuse T2 hypointensity and measures 3.0 x 2.3 cm. Differential diagnosis includes ovarian fibrothecoma and broad ligament fibroid. Other: No peritoneal thickening or abnormal free fluid. Musculoskeletal:  Unremarkable. IMPRESSION: 3.0 cm solid T2 hypointense mass in left adnexa. This is suspicious for an ovarian fibrothecoma, however differential diagnosis also includes a broad ligament fibroid. Previous hysterectomy. Electronically Signed   By: Earle Gell M.D.   On: 10/10/2018 08:32   Ct Abdomen Pelvis W Contrast  Result Date: 08/29/2018 CLINICAL DATA:  Abnormal weight loss of 20 lb in past 3 months. Non localized abdominal pain. Nausea and diarrhea. EXAM: CT ABDOMEN AND PELVIS WITH CONTRAST TECHNIQUE: Multidetector CT imaging of the abdomen and pelvis was performed using the standard protocol following bolus administration of intravenous contrast. CONTRAST:  117mL ISOVUE-300 IOPAMIDOL (ISOVUE-300) INJECTION 61% COMPARISON:  06/11/2006 FINDINGS: Lower Chest: No acute findings. Hepatobiliary: No hepatic masses identified. Gallbladder is nearly completely empty. No evidence of biliary ductal dilatation. Pancreas:  No mass or inflammatory changes. Spleen: Within normal limits in size and appearance.  Adrenals/Urinary Tract: No masses identified. No evidence of hydronephrosis. Stomach/Bowel: No evidence of obstruction, inflammatory  process or abnormal fluid collections. Normal appendix visualized. Vascular/Lymphatic: No pathologically enlarged lymph nodes. No abdominal aortic aneurysm. Reproductive: Previous hysterectomy. A masses seen in the left adnexa measuring 3.1 x 2.7 cm which has higher than fluid attenuation. This could represent a complex cyst or enhancing mass. No other pelvic mass or free fluid identified. Other:  No evidence peritoneal thickening or ascites. Musculoskeletal: No suspicious bone lesions identified. Severe bilateral hip arthritis. IMPRESSION: 3.1 cm left adnexal mass, which could represent a complex cyst or solid mass. Recommend transvaginal pelvic ultrasound for further evaluation. Electronically Signed   By: Earle Gell M.D.   On: 08/29/2018 12:53   .   Outpatient Encounter Medications as of 11/16/2018  Medication Sig  . acetaminophen (TYLENOL) 650 MG CR tablet Take 1,300 mg by mouth every 8 (eight) hours as needed for pain.  Marland Kitchen amLODipine (NORVASC) 5 MG tablet Take 5 mg by mouth daily.   Marland Kitchen atorvastatin (LIPITOR) 20 MG tablet Take 20 mg by mouth at bedtime.   Marland Kitchen losartan (COZAAR) 100 MG tablet Take 100 mg by mouth daily.   . metFORMIN (GLUCOPHAGE) 1000 MG tablet Take 1,000 mg by mouth 2 (two) times daily with a meal.   . metoprolol succinate (TOPROL-XL) 25 MG 24 hr tablet Take 25 mg by mouth daily.   . traMADol (ULTRAM) 50 MG tablet Take 1 tablet (50 mg total) by mouth every 6 (six) hours as needed.  . meclizine (ANTIVERT) 25 MG tablet Take 25 mg by mouth 2 (two) times daily as needed for dizziness.    No facility-administered encounter medications on file as of 11/16/2018.    Allergies  Allergen Reactions  . Codeine Nausea Only    Past Medical History:  Diagnosis Date  . Anxiety   . DM (diabetes mellitus) ([place])   . High cholesterol   . HTN (hypertension)   . Thrombocytopenia ([place])    Past Surgical History:  Procedure Laterality Date  . BREAST EXCISIONAL BIOPSY Right 1992   benign  .  COLONOSCOPY    . EYE EXAMINATION UNDER ANESTHESIA W/ RETINAL CRYOTHERAPY AND RETINAL LASER    . EYE SURGERY Bilateral 2009   Cataract  . ROBOTIC ASSISTED SALPINGO OOPHERECTOMY Bilateral 10/24/2018   Procedure: XI ROBOTIC ASSISTED BILATERAL SALPINGO OOPHORECTOMY;  Surgeon: Everitt Amber, MD;  Location: [facility];  Service: Gynecology;  Laterality: Bilateral;  . VAGINAL HYSTERECTOMY  1980's   Ovaries not removed        Past Gynecological History:   GYNECOLOGIC HISTORY:  . No LMP recorded. Patient is postmenopausal.  Hysterectomy 1980 . Menarche: 78 years old . P 1 . Contraceptive h/o OCP . HRT None  . Last Pap "always normal" NA since hyst Family Hx:  Family History  Problem Relation Age of Onset  . Diabetes Mother   . Hypertension Mother   . Diabetes Father   . Hypertension Father   . Heart failure Father   . Hypertension Sister   . Heart failure Sister   . Diabetes Sister   . Diabetes Brother   . Hypertension Brother    Social Hx:  Marland Kitchen Tobacco use: former smoker . Alcohol use: none . Illicit Drug use: none . Illicit IV Drug use: none    Review of Systems: Review of Systems  Constitutional: Positive for unexpected weight change.  Musculoskeletal: Positive for arthralgias and gait problem.  Neurological: Positive for gait  problem.  All other systems reviewed and are negative.   Vitals:  Vitals:   11/16/18 1518  BP: 135/66  Pulse: 74  Resp: 16  Temp: 98.4 F (36.9 C)  SpO2: 100%   Vitals:   11/16/18 1518  Weight: 119 lb (54 kg)  Height: 4\' 11"  (1.499 m)   Body mass index is 24.04 kg/m.  Physical Exam: General :  Well developed, 78 y.o., female in no apparent distress HEENT:  Normocephalic/atraumatic, symmetric, EOMI, eyelids normal Neck:   Supple, no masses.  Lymphatics:  No cervical/ submandibular/ supraclavicular/ infraclavicular/ inguinal adenopathy Respiratory:  Respirations unlabored, no use of accessory muscles CV:    Deferred Breast:  Deferred Musculoskeletal: No CVA tenderness, normal muscle strength. Abdomen:  Soft, non-tender and nondistended. No evidence of hernia. No masses.Well healed incisions. Extremities:  No lymphedema, no erythema, non-tender. Skin:   Normal inspection Neuro/Psych:  No focal motor deficit, no abnormal mental status. Normal gait. Normal affect. Alert and oriented to person, place, and time  Genito Urinary: deferred   Assessment  History of benign left ovarian fibrothecoma s/p robotic BSO on 10/24/18  Plan   For follow-up with Dr Landry Mellow annually for wellness care. Thereasa Solo, MD  Gynecologic Oncologist 11/16/2018, 3:52 PM    Cc: Christophe Louis, MD (Referring Ob/Gyn) Charolette Forward, MD  (PCP)

## 2018-12-07 ENCOUNTER — Other Ambulatory Visit: Payer: Self-pay | Admitting: Obstetrics and Gynecology

## 2018-12-07 DIAGNOSIS — Z1231 Encounter for screening mammogram for malignant neoplasm of breast: Secondary | ICD-10-CM

## 2019-01-06 ENCOUNTER — Ambulatory Visit
Admission: RE | Admit: 2019-01-06 | Discharge: 2019-01-06 | Disposition: A | Discharge status: Home / Self Care | Payer: Medicare Other | Source: Ambulatory Visit | Attending: Obstetrics and Gynecology | Admitting: Obstetrics and Gynecology

## 2019-01-06 DIAGNOSIS — Z1231 Encounter for screening mammogram for malignant neoplasm of breast: Secondary | ICD-10-CM | POA: Diagnosis not present

## 2019-01-06 IMAGING — MG DIGITAL SCREENING BILATERAL MAMMOGRAM WITH TOMO AND CAD
8 series · 8 of 24 positions shown · non-contrast
Comparison: Previous exam(s).

CLINICAL DATA: Screening.

EXAM:
DIGITAL SCREENING BILATERAL MAMMOGRAM WITH TOMO AND CAD

[L MLO synth-2D]
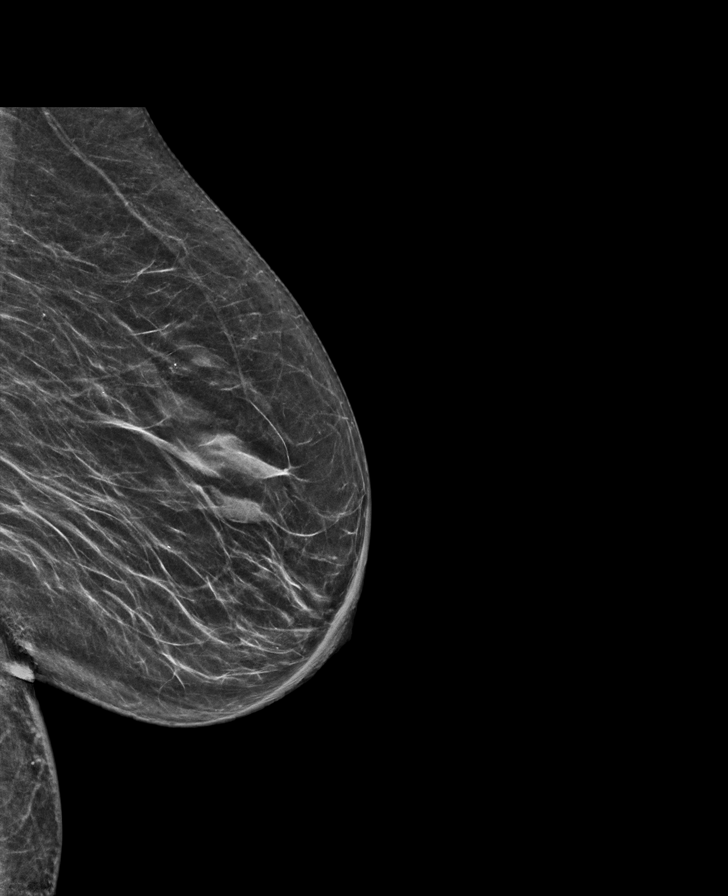

[L CC synth-2D]
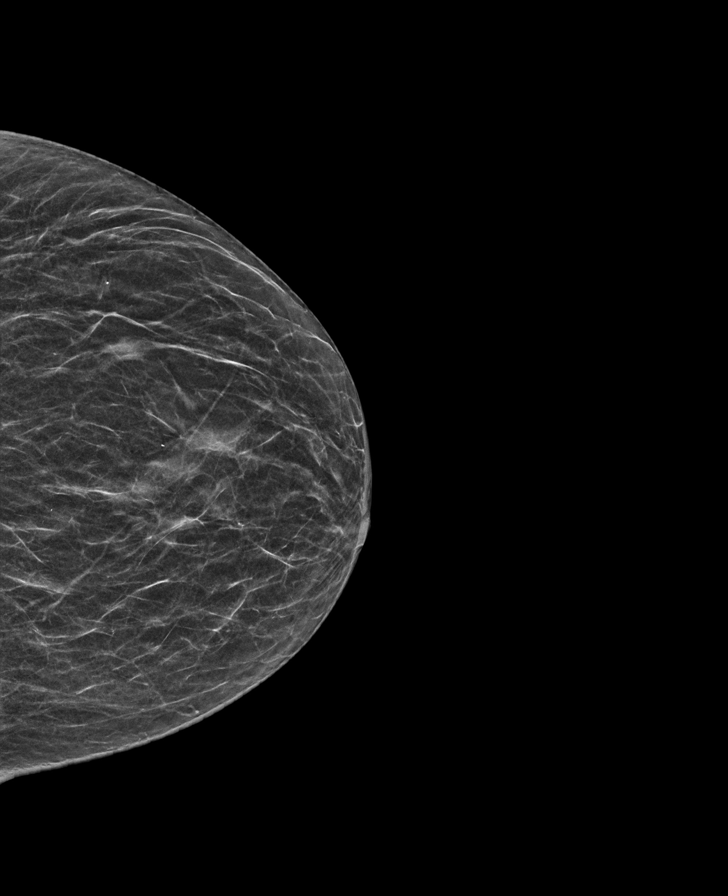

[R MLO synth-2D]
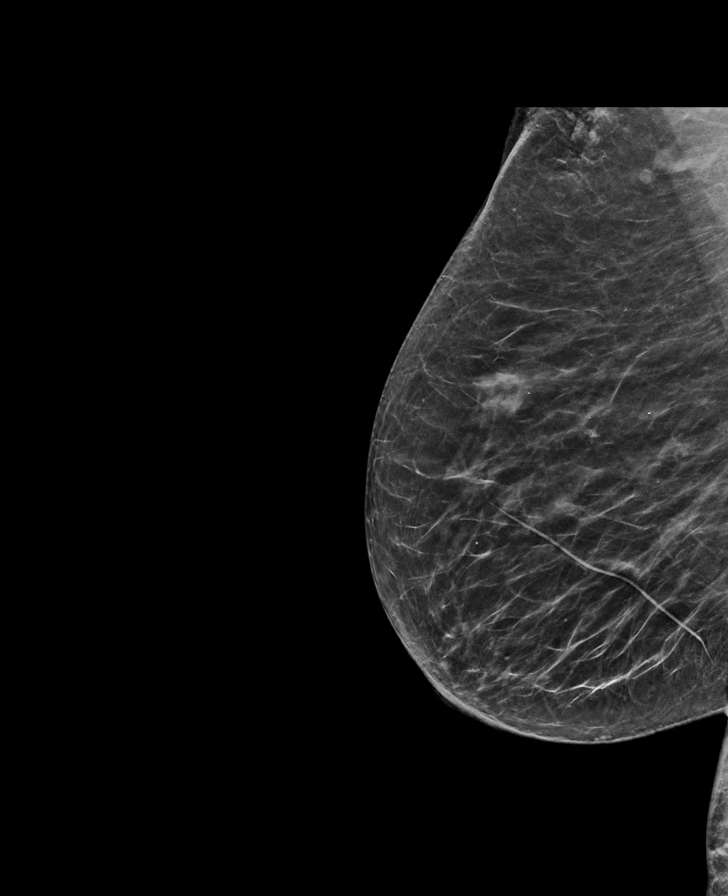

[R CC synth-2D]
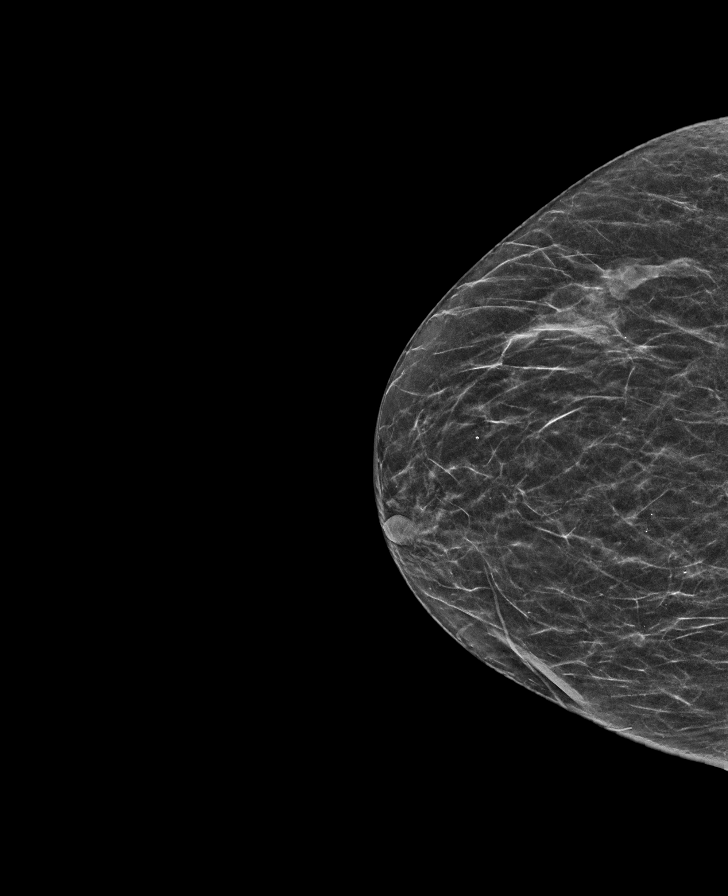

[R MLO tomo · tomo slice 29/58.0]
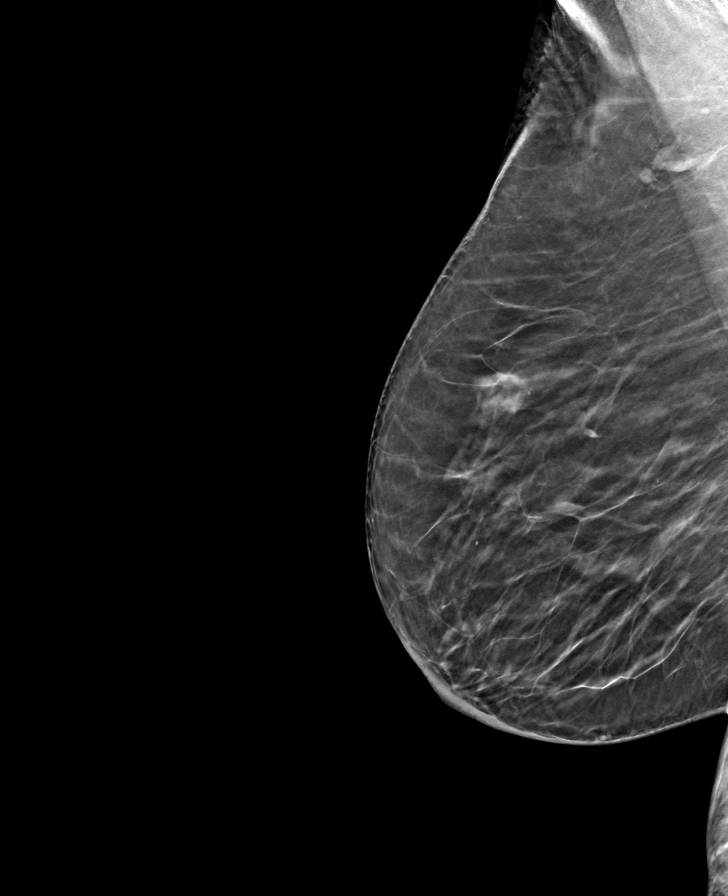

[L MLO tomo · tomo slice 29/57.0]
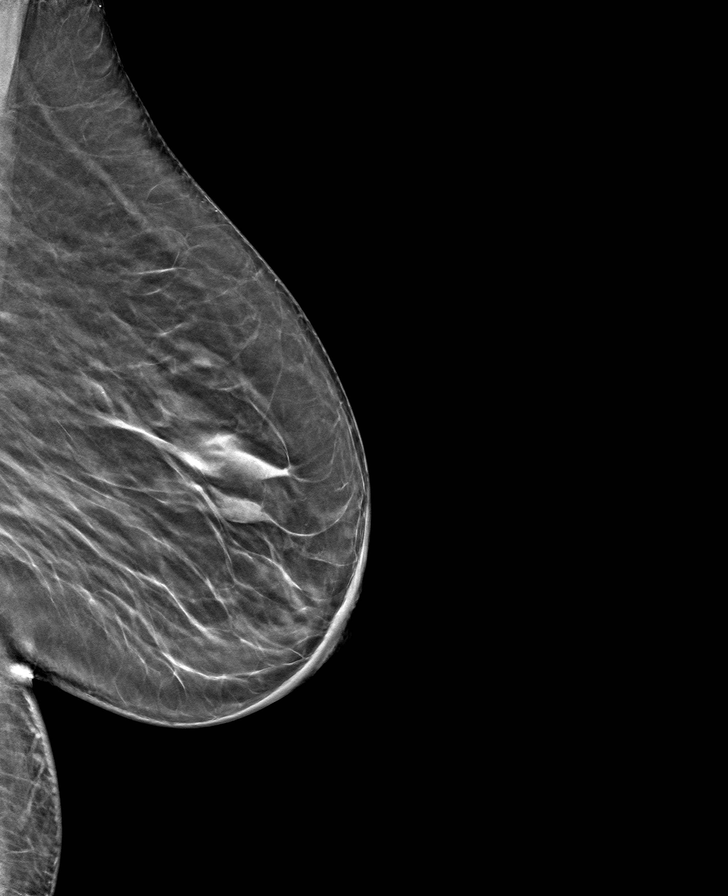

[R CC tomo · tomo slice 25/49.0]
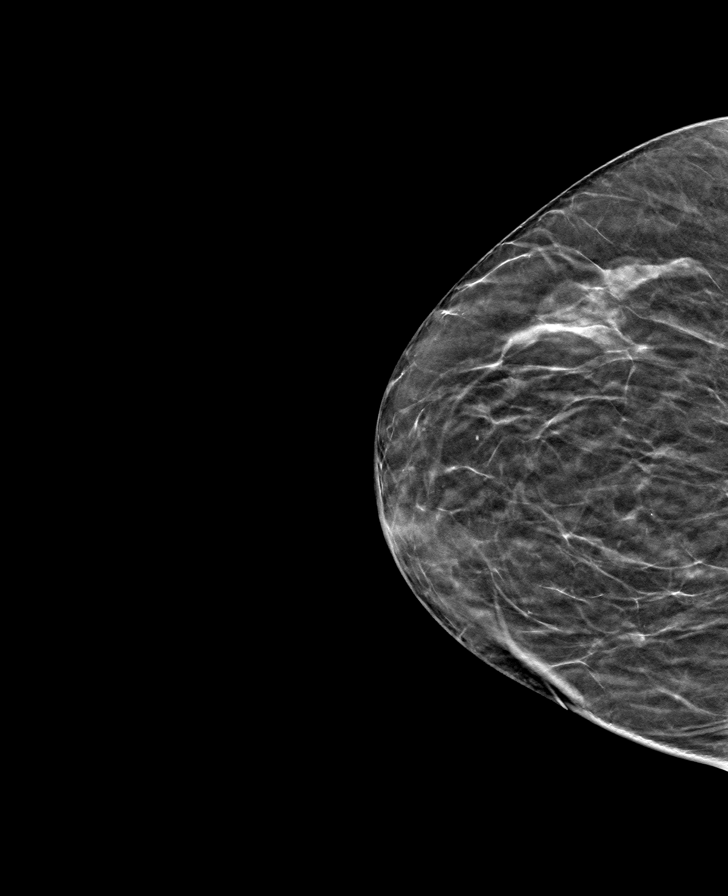

[L CC tomo · tomo slice 23/46.0]
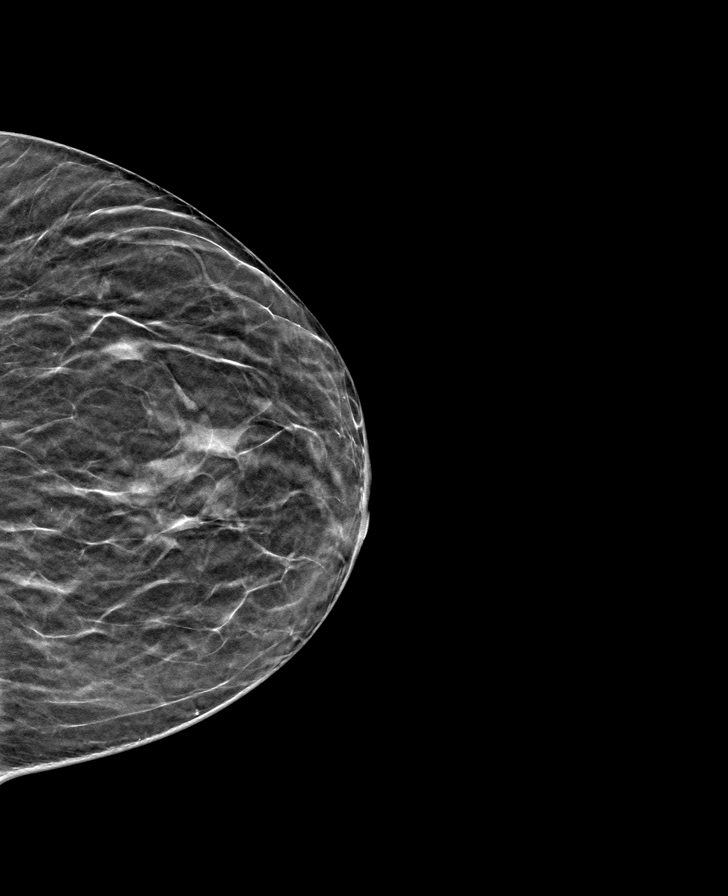

[8 of 24 positions shown; findings below may reference images not displayed]

ACR Breast Density Category b: There are scattered areas of
fibroglandular density.
FINDINGS: There are no findings suspicious for malignancy. Images were
processed with CAD.
IMPRESSION: No mammographic evidence of malignancy. A result letter of this
screening mammogram will be mailed directly to the patient.

RECOMMENDATION:
Screening mammogram in one year. (Code:CN-[id number]-775)

BI-RADS CATEGORY  1: Negative.

## 2019-03-22 DIAGNOSIS — E785 Hyperlipidemia, unspecified: Secondary | ICD-10-CM | POA: Diagnosis not present

## 2019-03-22 DIAGNOSIS — I10 Essential (primary) hypertension: Secondary | ICD-10-CM | POA: Diagnosis not present

## 2019-03-22 DIAGNOSIS — E119 Type 2 diabetes mellitus without complications: Secondary | ICD-10-CM | POA: Diagnosis not present

## 2019-04-03 DIAGNOSIS — E785 Hyperlipidemia, unspecified: Secondary | ICD-10-CM | POA: Diagnosis not present

## 2019-04-03 DIAGNOSIS — E119 Type 2 diabetes mellitus without complications: Secondary | ICD-10-CM | POA: Diagnosis not present

## 2019-04-03 DIAGNOSIS — F419 Anxiety disorder, unspecified: Secondary | ICD-10-CM | POA: Diagnosis not present

## 2019-04-03 DIAGNOSIS — I10 Essential (primary) hypertension: Secondary | ICD-10-CM | POA: Diagnosis not present

## 2019-04-12 DIAGNOSIS — E119 Type 2 diabetes mellitus without complications: Secondary | ICD-10-CM | POA: Diagnosis not present

## 2019-04-12 DIAGNOSIS — H40051 Ocular hypertension, right eye: Secondary | ICD-10-CM | POA: Diagnosis not present

## 2019-04-12 DIAGNOSIS — H3500 Unspecified background retinopathy: Secondary | ICD-10-CM | POA: Diagnosis not present

## 2019-04-12 DIAGNOSIS — Z961 Presence of intraocular lens: Secondary | ICD-10-CM | POA: Diagnosis not present

## 2019-05-31 DIAGNOSIS — Z9189 Other specified personal risk factors, not elsewhere classified: Secondary | ICD-10-CM | POA: Diagnosis not present

## 2019-05-31 DIAGNOSIS — Z7251 High risk heterosexual behavior: Secondary | ICD-10-CM | POA: Diagnosis not present

## 2019-07-06 DIAGNOSIS — I10 Essential (primary) hypertension: Secondary | ICD-10-CM | POA: Diagnosis not present

## 2019-07-06 DIAGNOSIS — E785 Hyperlipidemia, unspecified: Secondary | ICD-10-CM | POA: Diagnosis not present

## 2019-07-06 DIAGNOSIS — E119 Type 2 diabetes mellitus without complications: Secondary | ICD-10-CM | POA: Diagnosis not present

## 2019-07-06 DIAGNOSIS — F419 Anxiety disorder, unspecified: Secondary | ICD-10-CM | POA: Diagnosis not present

## 2019-07-17 DIAGNOSIS — Z0120 Encounter for dental examination and cleaning without abnormal findings: Secondary | ICD-10-CM | POA: Diagnosis not present

## 2019-08-03 DIAGNOSIS — Z23 Encounter for immunization: Secondary | ICD-10-CM | POA: Diagnosis not present

## 2019-10-04 DIAGNOSIS — I10 Essential (primary) hypertension: Secondary | ICD-10-CM | POA: Diagnosis not present

## 2019-10-04 DIAGNOSIS — F419 Anxiety disorder, unspecified: Secondary | ICD-10-CM | POA: Diagnosis not present

## 2019-10-04 DIAGNOSIS — E785 Hyperlipidemia, unspecified: Secondary | ICD-10-CM | POA: Diagnosis not present

## 2019-10-04 DIAGNOSIS — E119 Type 2 diabetes mellitus without complications: Secondary | ICD-10-CM | POA: Diagnosis not present

## 2020-01-03 ENCOUNTER — Other Ambulatory Visit: Payer: Self-pay | Admitting: Obstetrics and Gynecology

## 2020-01-03 DIAGNOSIS — Z1231 Encounter for screening mammogram for malignant neoplasm of breast: Secondary | ICD-10-CM

## 2020-02-05 ENCOUNTER — Other Ambulatory Visit: Payer: Self-pay

## 2020-02-05 ENCOUNTER — Ambulatory Visit
Admission: RE | Admit: 2020-02-05 | Discharge: 2020-02-05 | Disposition: A | Discharge status: Home / Self Care | Payer: Medicare Other | Source: Ambulatory Visit | Attending: Obstetrics and Gynecology | Admitting: Obstetrics and Gynecology

## 2020-02-05 DIAGNOSIS — Z1231 Encounter for screening mammogram for malignant neoplasm of breast: Secondary | ICD-10-CM

## 2020-02-05 IMAGING — MG DIGITAL SCREENING BILAT W/ TOMO W/ CAD
8 series · 8 of 24 positions shown · non-contrast
Comparison: Previous exam(s).

CLINICAL DATA: Screening.

EXAM:
DIGITAL SCREENING BILATERAL MAMMOGRAM WITH TOMO AND CAD

[L CC synth-2D]
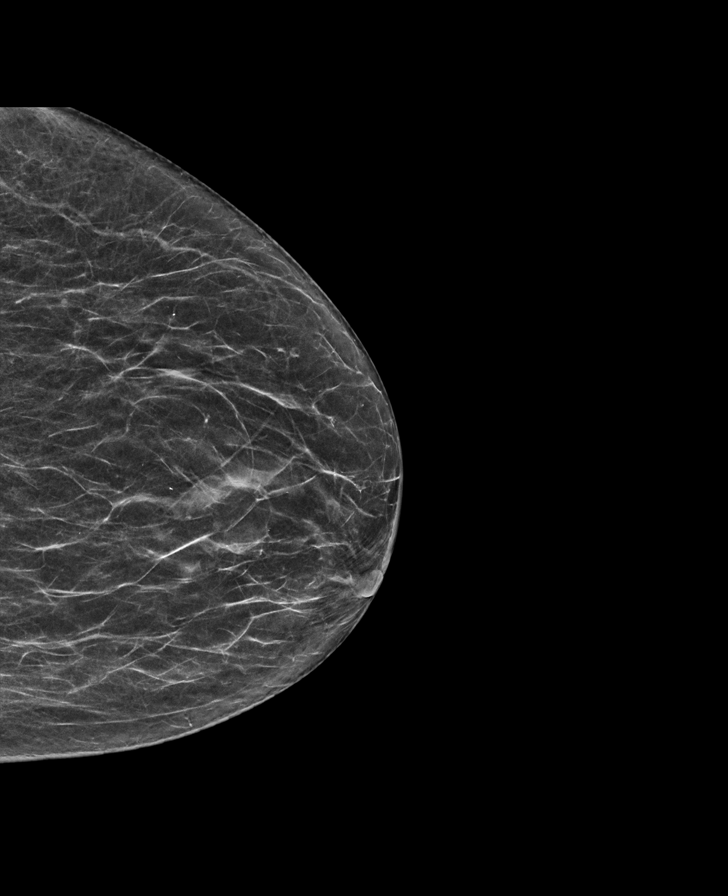

[R CC synth-2D]
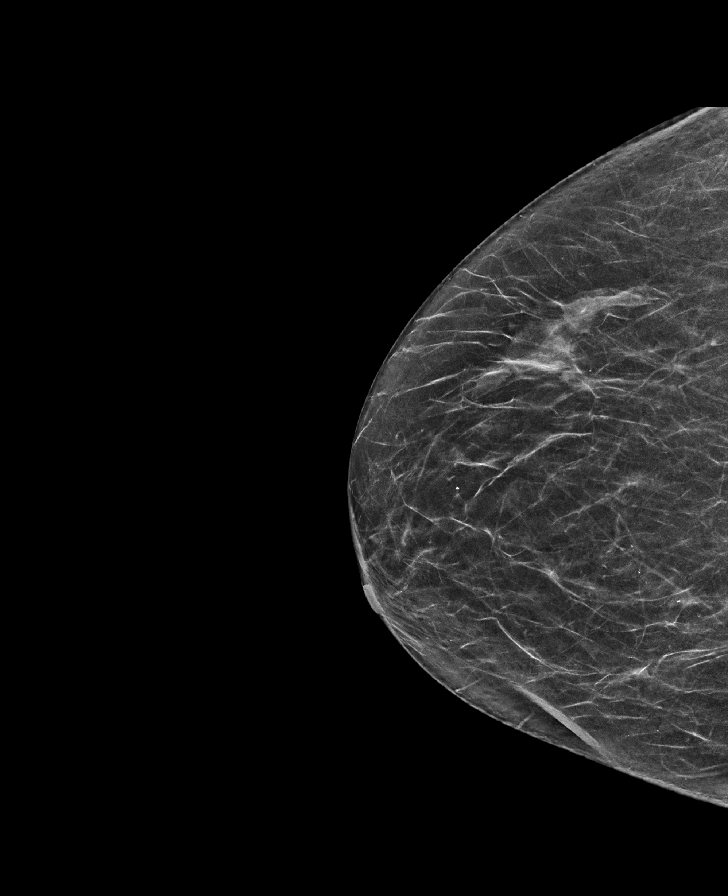

[L MLO synth-2D]
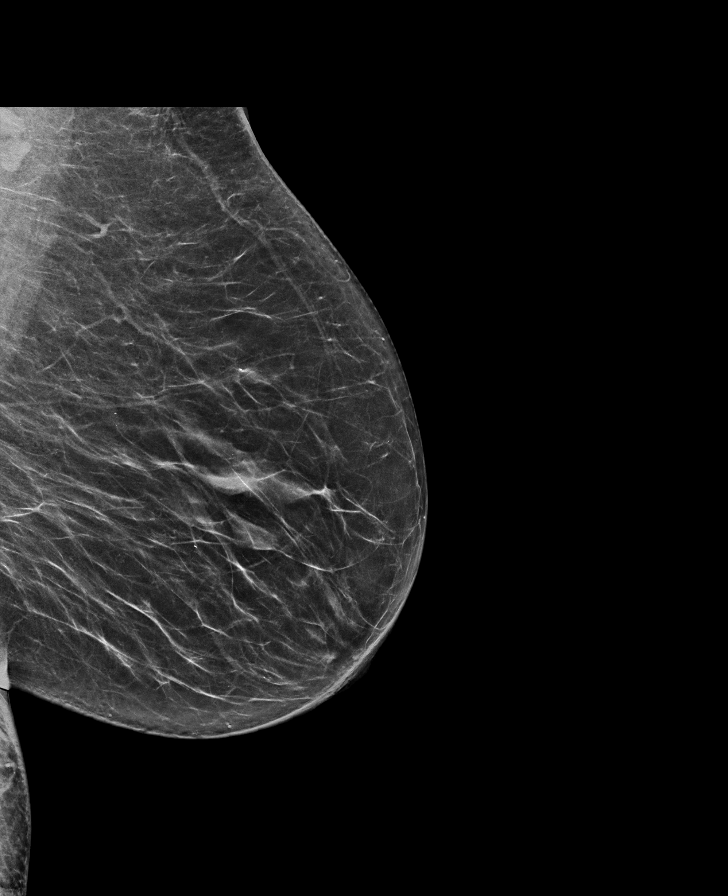

[R MLO synth-2D]
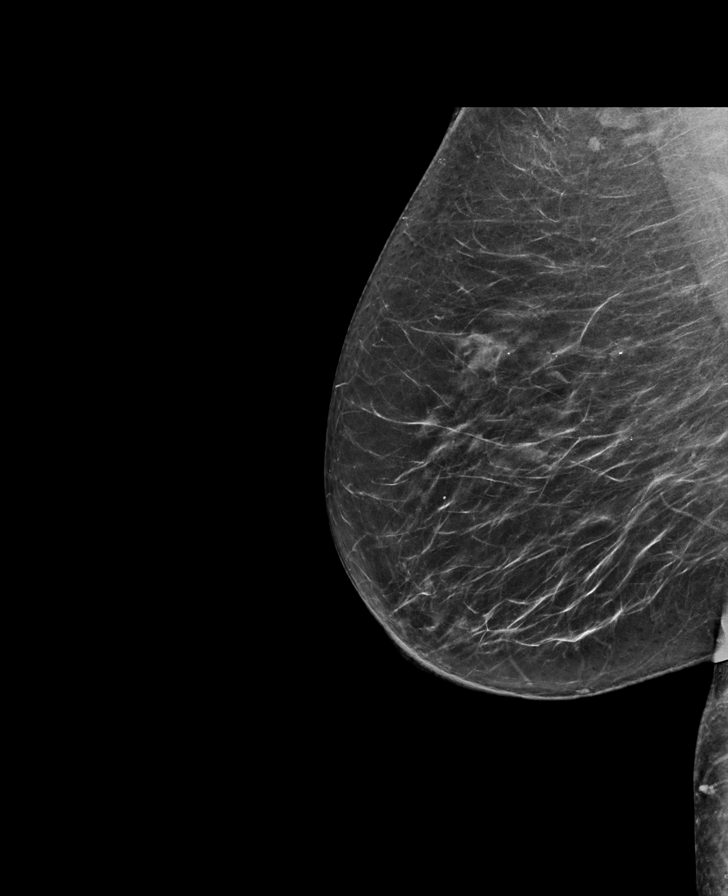

[R CC tomo · tomo slice 31/60.0]
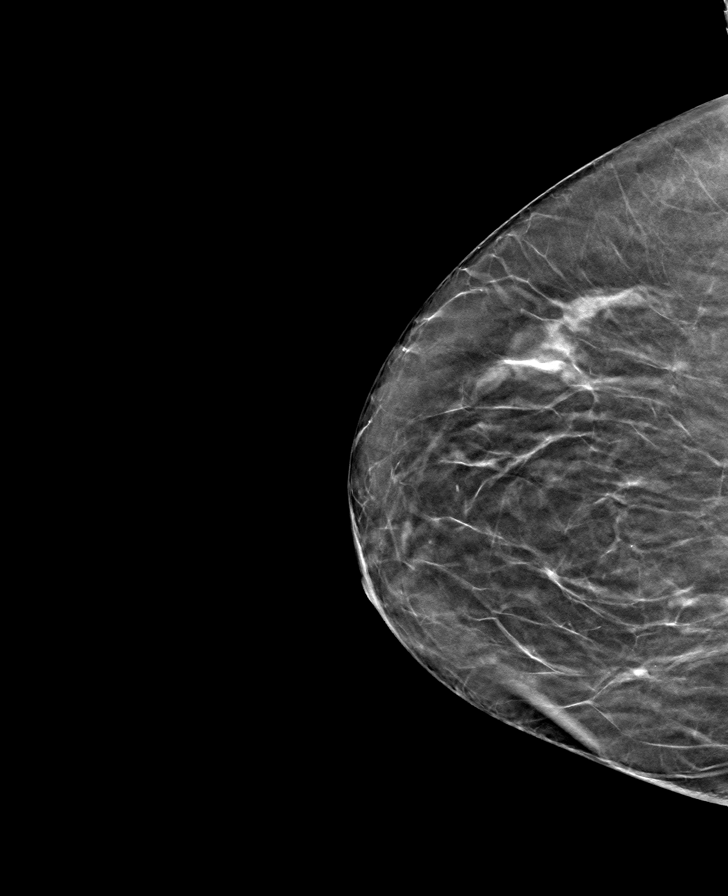

[R MLO tomo · tomo slice 33/65.0]
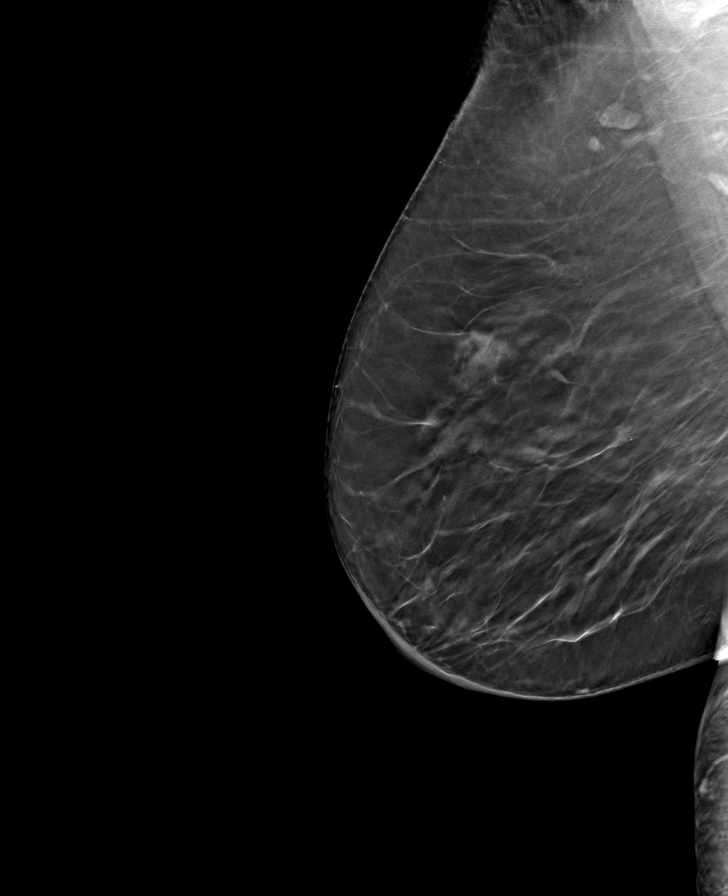

[L CC tomo · tomo slice 29/58.0]
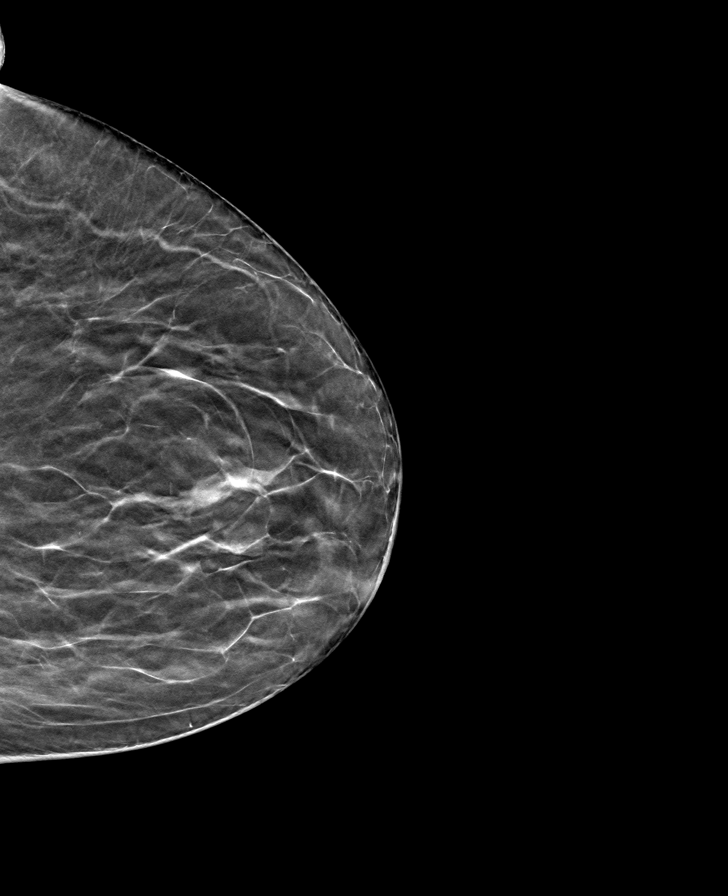

[L MLO tomo · tomo slice 33/66.0]
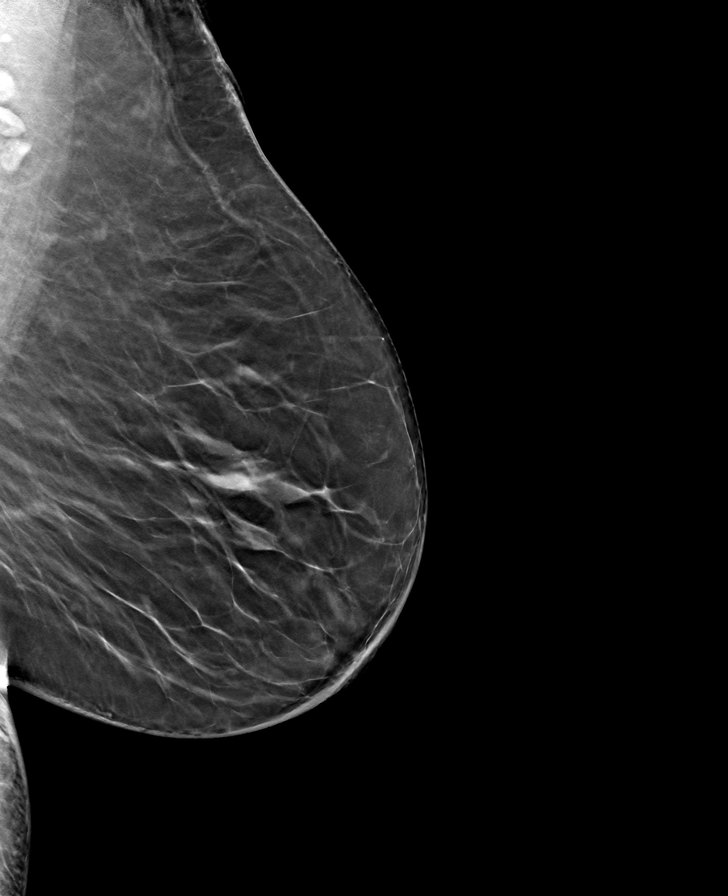

[8 of 24 positions shown; findings below may reference images not displayed]

ACR Breast Density Category b: There are scattered areas of
fibroglandular density.
FINDINGS: There are no findings suspicious for malignancy. Images were
processed with CAD.
IMPRESSION: No mammographic evidence of malignancy. A result letter of this
screening mammogram will be mailed directly to the patient.

RECOMMENDATION:
Screening mammogram in one year. (Code:CN-U-775)

BI-RADS CATEGORY  1: Negative.

## 2021-01-28 ENCOUNTER — Other Ambulatory Visit: Payer: Self-pay | Admitting: Obstetrics and Gynecology

## 2021-01-28 DIAGNOSIS — Z1231 Encounter for screening mammogram for malignant neoplasm of breast: Secondary | ICD-10-CM

## 2021-03-20 ENCOUNTER — Ambulatory Visit
Admission: RE | Admit: 2021-03-20 | Discharge: 2021-03-20 | Disposition: A | Discharge status: Home / Self Care | Payer: Medicare Other | Source: Ambulatory Visit | Attending: Obstetrics and Gynecology | Admitting: Obstetrics and Gynecology

## 2021-03-20 ENCOUNTER — Other Ambulatory Visit: Payer: Self-pay

## 2021-03-20 ENCOUNTER — Ambulatory Visit: Payer: Medicare Other

## 2021-03-20 DIAGNOSIS — Z1231 Encounter for screening mammogram for malignant neoplasm of breast: Secondary | ICD-10-CM

## 2021-03-20 IMAGING — MG MM DIGITAL SCREENING BILAT W/ TOMO AND CAD
6 of 10 series · 6 of 30 positions shown · non-contrast
Comparison: Previous exam(s).

CLINICAL DATA: Screening.

EXAM:
DIGITAL SCREENING BILATERAL MAMMOGRAM WITH TOMOSYNTHESIS AND CAD
TECHNIQUE: Bilateral screening digital craniocaudal and mediolateral oblique
mammograms were obtained. Bilateral screening digital breast
tomosynthesis was performed. The images were evaluated with
computer-aided detection.

[L MLO synth-2D (1 of 2)]
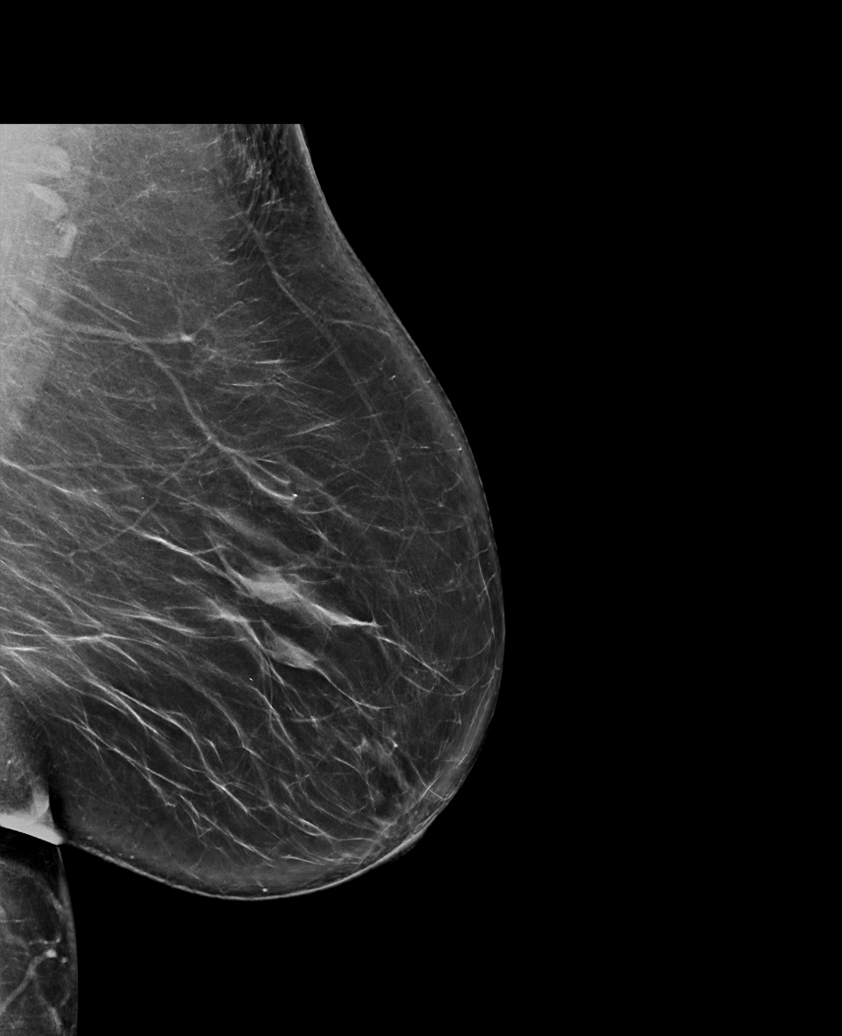

[L CC synth-2D]
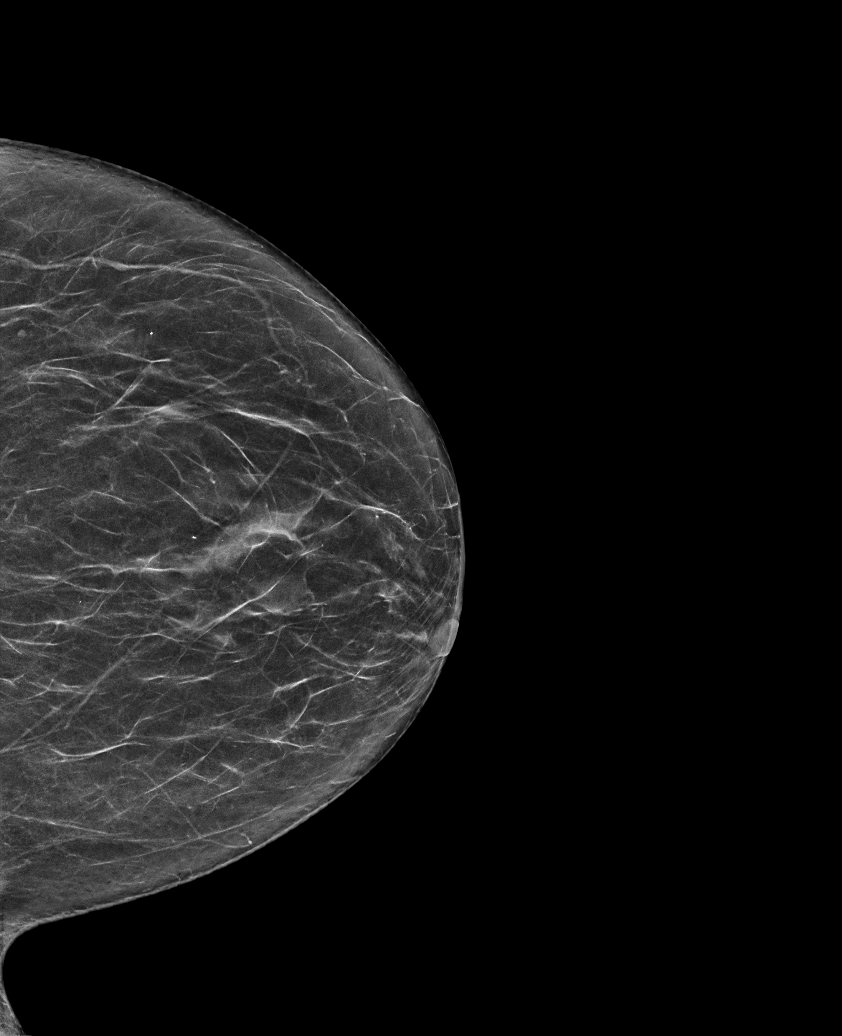

[L MLO synth-2D (2 of 2)]
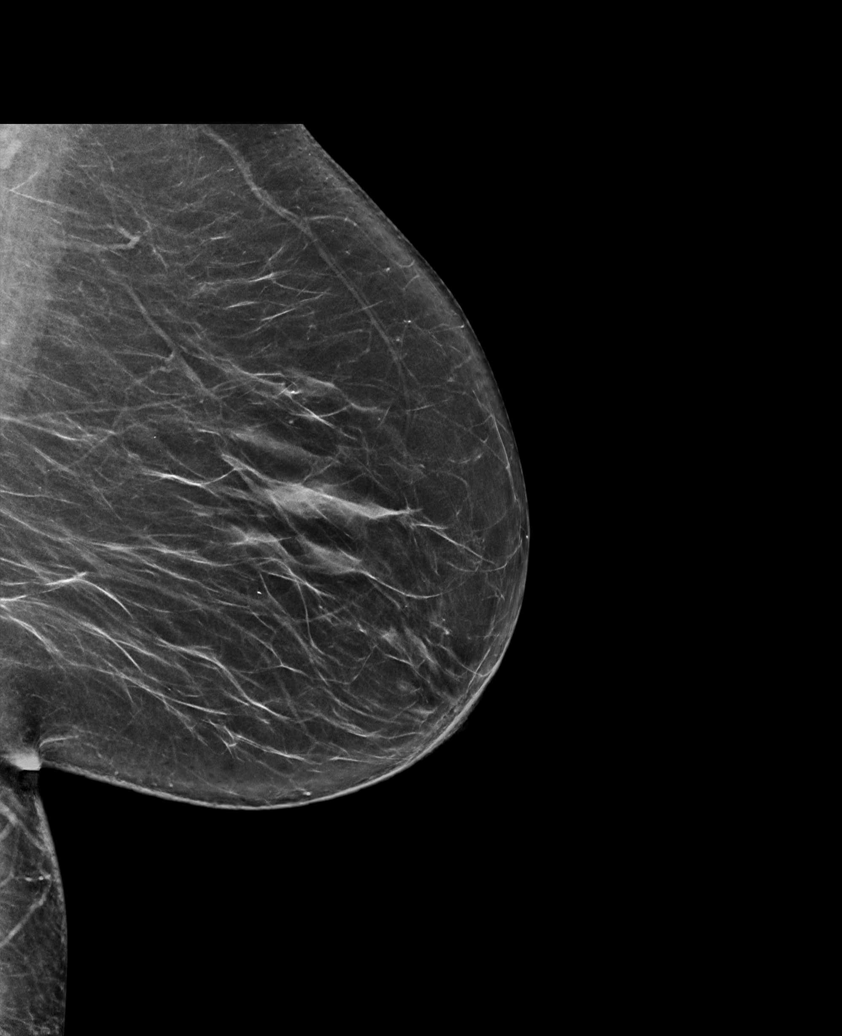

[R MLO synth-2D]
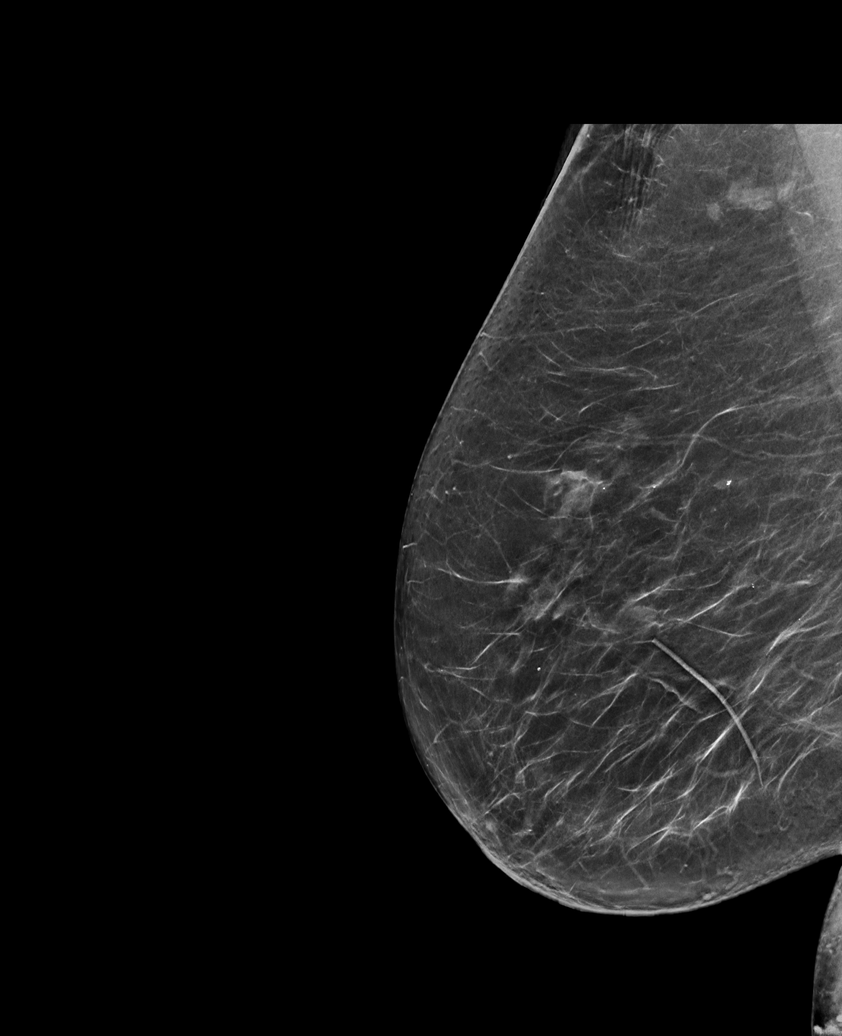

[R CC synth-2D]
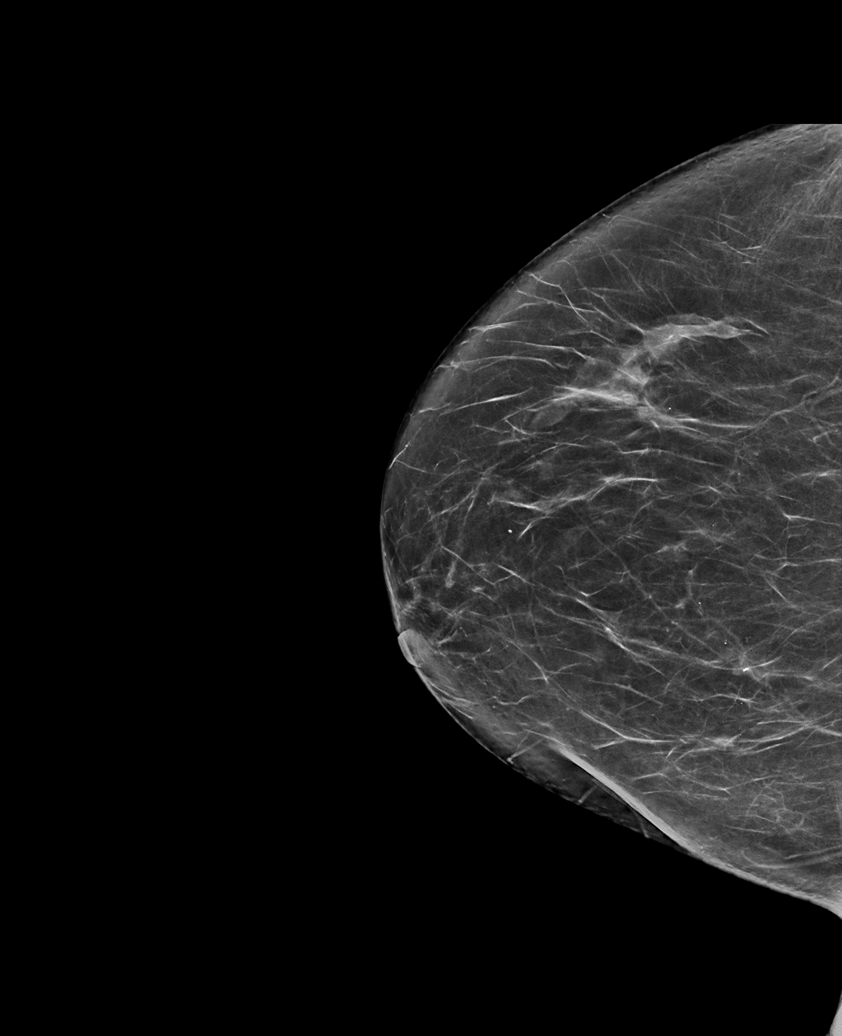

[R CC tomo · tomo slice 38/75.0]
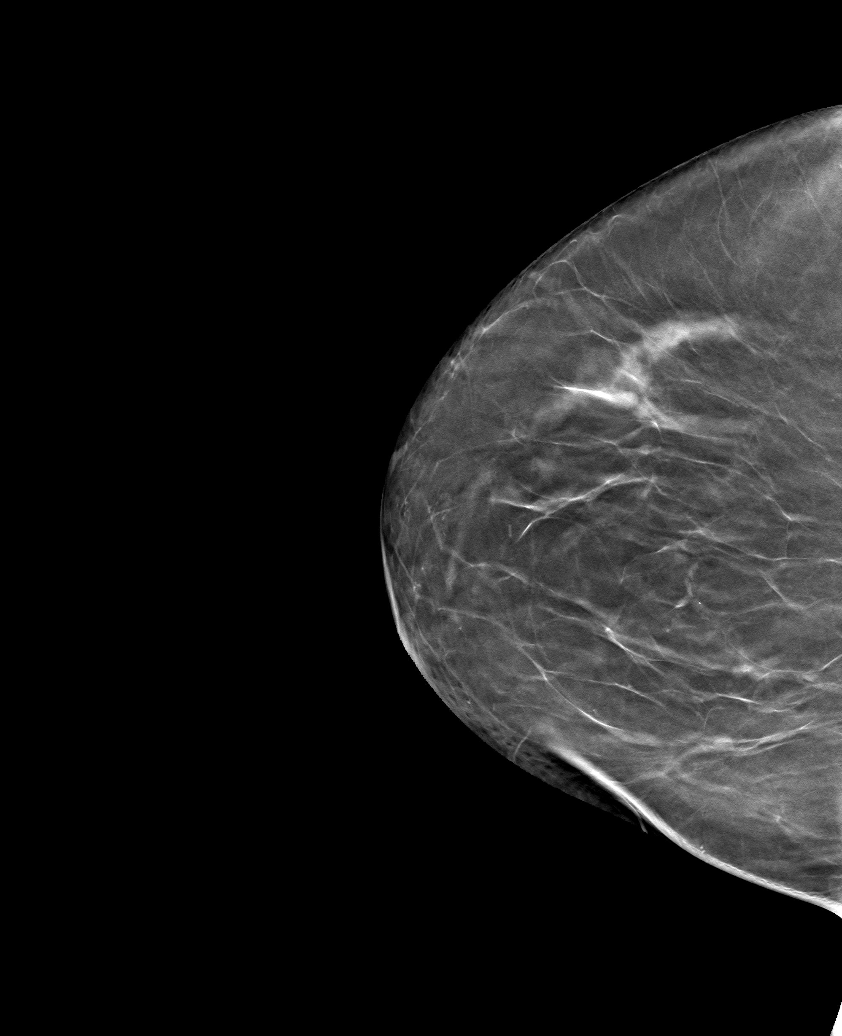

[6 of 30 positions shown; findings below may reference images not displayed]

ACR Breast Density Category b: There are scattered areas of
fibroglandular density.
FINDINGS: There are no findings suspicious for malignancy. The images were
evaluated with computer-aided detection.
IMPRESSION: No mammographic evidence of malignancy. A result letter of this
screening mammogram will be mailed directly to the patient.

RECOMMENDATION:
Screening mammogram in one year. (Code:WJ-[id number])

BI-RADS CATEGORY  1: Negative.

## 2021-10-14 ENCOUNTER — Other Ambulatory Visit: Payer: Self-pay

## 2022-01-07 DIAGNOSIS — E1169 Type 2 diabetes mellitus with other specified complication: Secondary | ICD-10-CM | POA: Diagnosis not present

## 2022-01-07 DIAGNOSIS — M1990 Unspecified osteoarthritis, unspecified site: Secondary | ICD-10-CM | POA: Diagnosis not present

## 2022-01-07 DIAGNOSIS — I10 Essential (primary) hypertension: Secondary | ICD-10-CM | POA: Diagnosis not present

## 2022-02-20 DIAGNOSIS — L900 Lichen sclerosus et atrophicus: Secondary | ICD-10-CM | POA: Diagnosis not present

## 2022-03-17 ENCOUNTER — Other Ambulatory Visit: Payer: Self-pay | Admitting: Obstetrics and Gynecology

## 2022-03-17 DIAGNOSIS — Z1231 Encounter for screening mammogram for malignant neoplasm of breast: Secondary | ICD-10-CM

## 2022-03-26 ENCOUNTER — Ambulatory Visit
Admission: RE | Admit: 2022-03-26 | Discharge: 2022-03-26 | Disposition: A | Discharge status: Home / Self Care | Payer: Medicare Other | Source: Ambulatory Visit | Attending: Obstetrics and Gynecology | Admitting: Obstetrics and Gynecology

## 2022-03-26 DIAGNOSIS — Z1231 Encounter for screening mammogram for malignant neoplasm of breast: Secondary | ICD-10-CM | POA: Diagnosis not present

## 2022-03-26 IMAGING — MG MM DIGITAL SCREENING BILAT W/ TOMO AND CAD
8 series · 8 of 24 positions shown · non-contrast
Comparison: Previous exam(s).

CLINICAL DATA: Screening.

EXAM:
DIGITAL SCREENING BILATERAL MAMMOGRAM WITH TOMOSYNTHESIS AND CAD
TECHNIQUE: Bilateral screening digital craniocaudal and mediolateral oblique
mammograms were obtained. Bilateral screening digital breast
tomosynthesis was performed. The images were evaluated with
computer-aided detection.

[L MLO synth-2D]
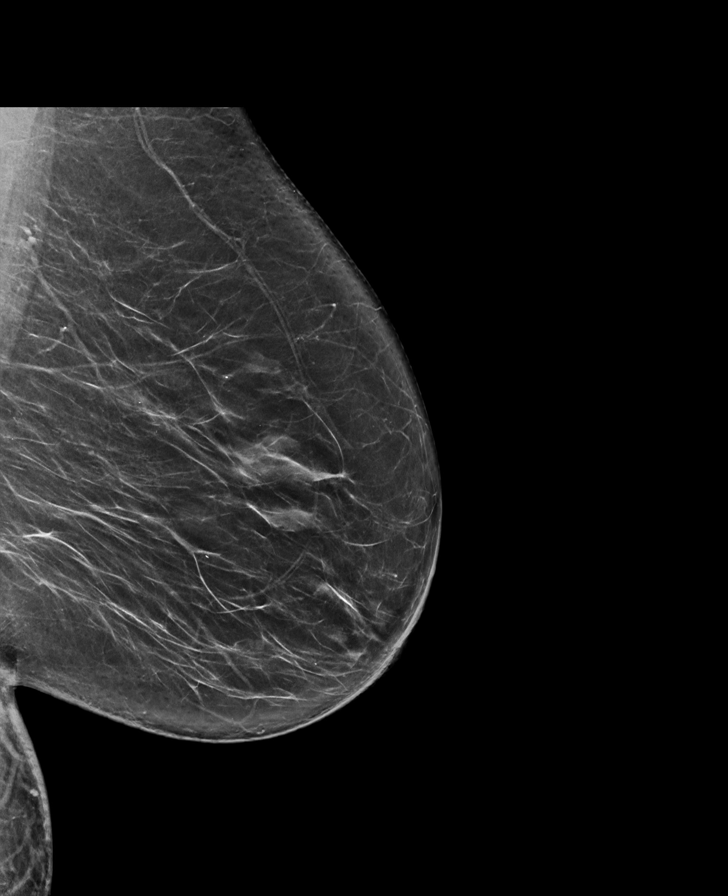

[L CC synth-2D]
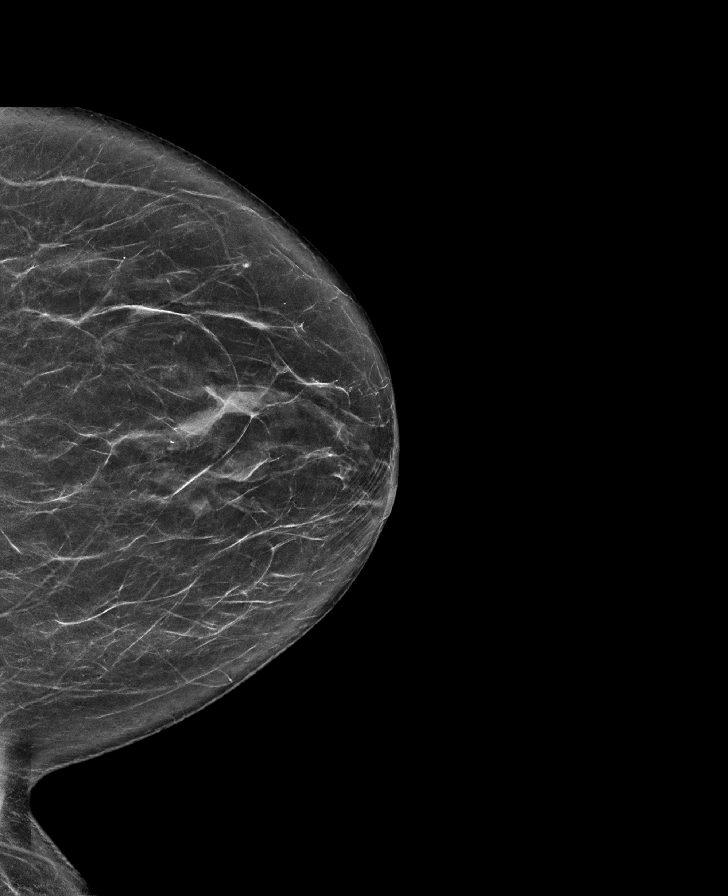

[R MLO synth-2D]
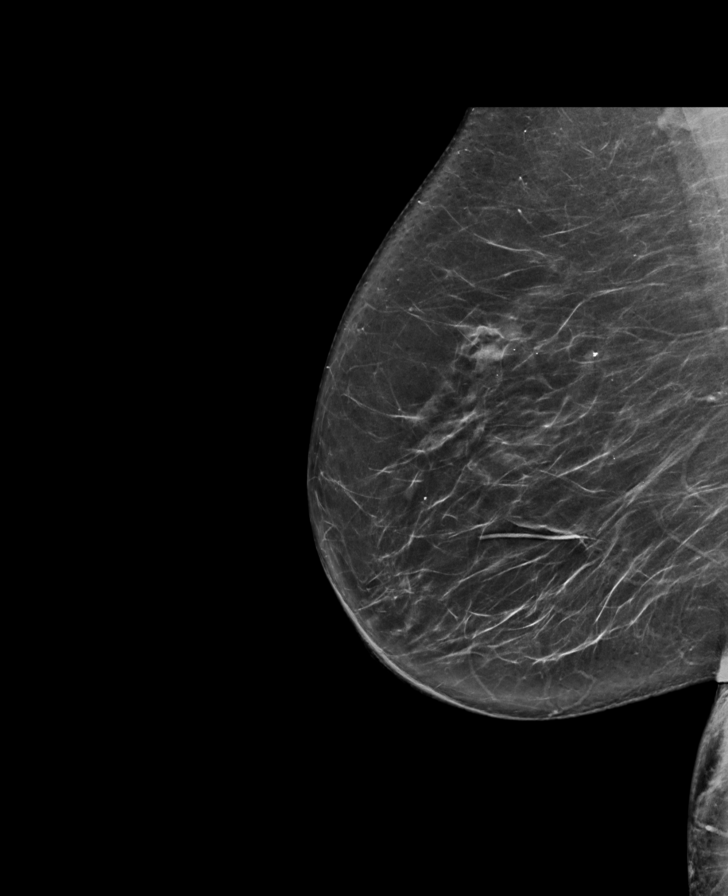

[R CC synth-2D]
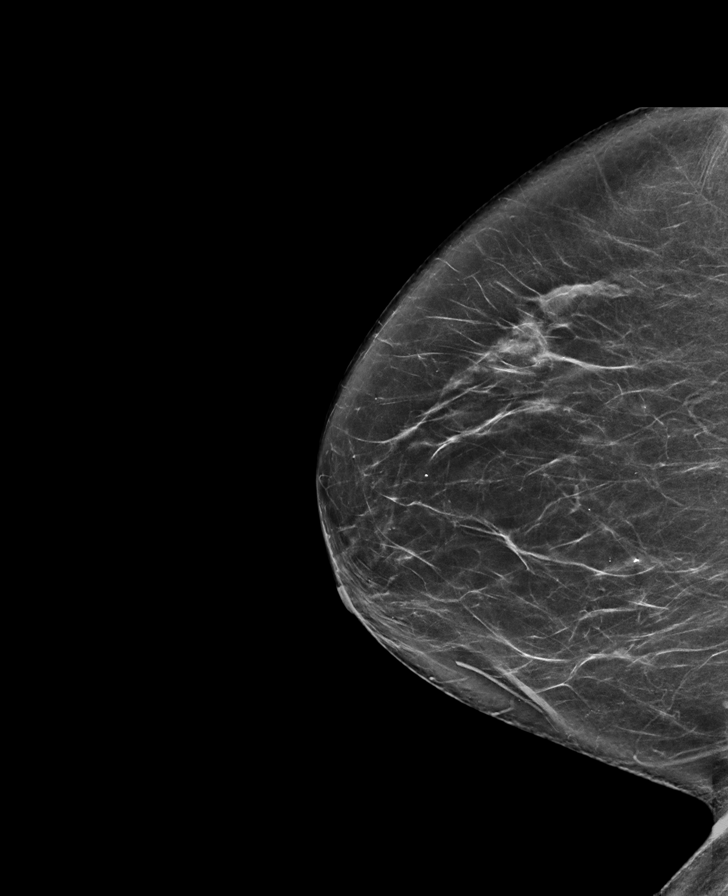

[R CC tomo · tomo slice 39/77.0]
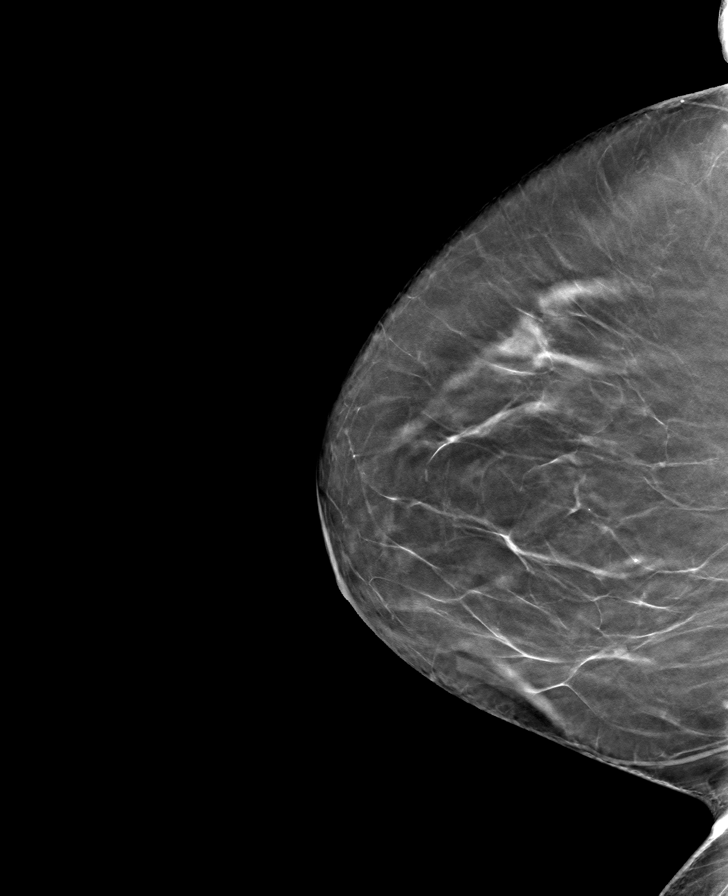

[L CC tomo · tomo slice 37/72.0]
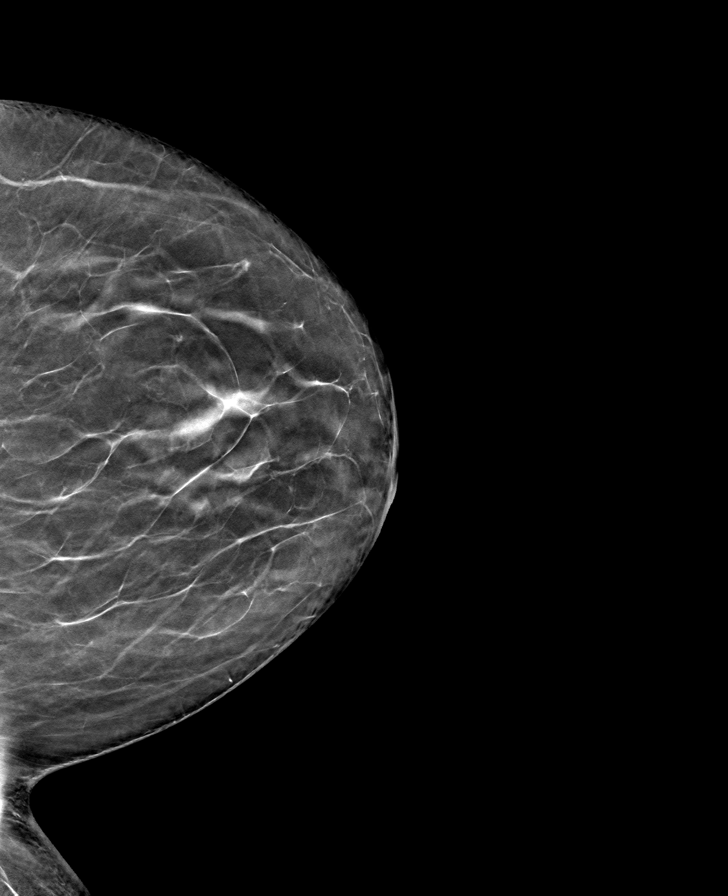

[R MLO tomo · tomo slice 40/79.0]
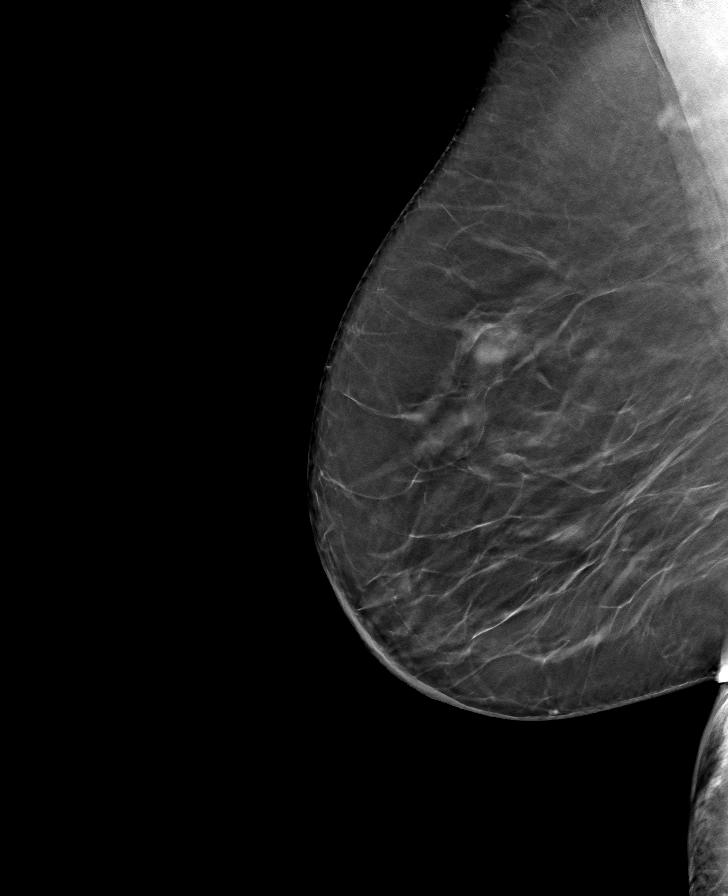

[L MLO tomo · tomo slice 40/79.0]
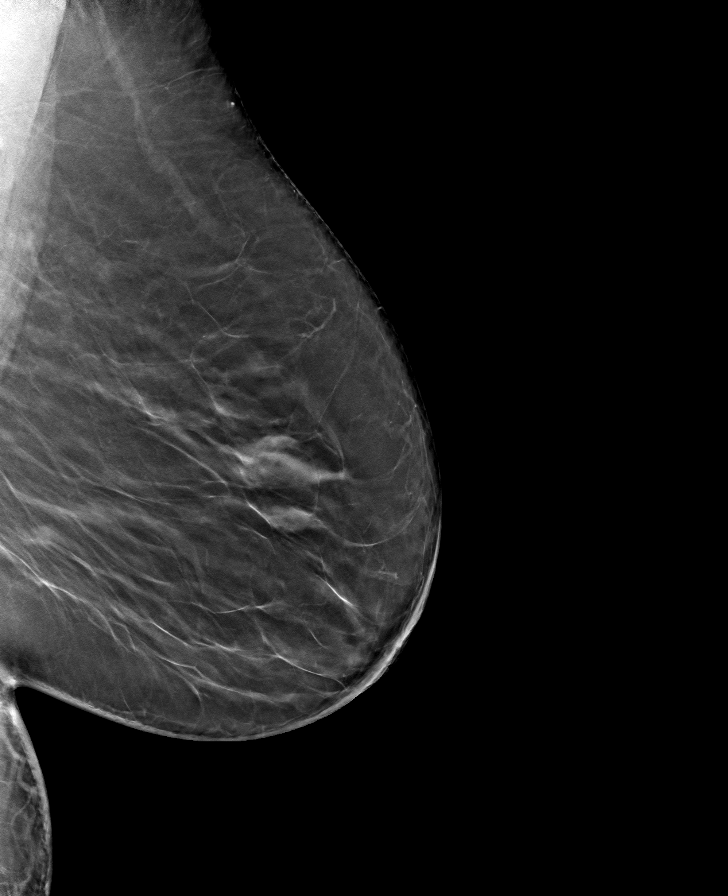

[8 of 24 positions shown; findings below may reference images not displayed]

ACR Breast Density Category b: There are scattered areas of
fibroglandular density.
FINDINGS: There are no findings suspicious for malignancy.
IMPRESSION: No mammographic evidence of malignancy. A result letter of this
screening mammogram will be mailed directly to the patient.

RECOMMENDATION:
Screening mammogram in one year. (Code:[id number])

BI-RADS CATEGORY  1: Negative.

## 2022-04-08 DIAGNOSIS — E785 Hyperlipidemia, unspecified: Secondary | ICD-10-CM | POA: Diagnosis not present

## 2022-04-08 DIAGNOSIS — E119 Type 2 diabetes mellitus without complications: Secondary | ICD-10-CM | POA: Diagnosis not present

## 2022-04-08 DIAGNOSIS — I10 Essential (primary) hypertension: Secondary | ICD-10-CM | POA: Diagnosis not present

## 2022-06-11 DIAGNOSIS — H52203 Unspecified astigmatism, bilateral: Secondary | ICD-10-CM | POA: Diagnosis not present

## 2022-06-22 DIAGNOSIS — Z9189 Other specified personal risk factors, not elsewhere classified: Secondary | ICD-10-CM | POA: Diagnosis not present

## 2022-06-22 DIAGNOSIS — Z7251 High risk heterosexual behavior: Secondary | ICD-10-CM | POA: Diagnosis not present

## 2022-06-22 DIAGNOSIS — N842 Polyp of vagina: Secondary | ICD-10-CM | POA: Diagnosis not present

## 2022-08-24 DIAGNOSIS — E119 Type 2 diabetes mellitus without complications: Secondary | ICD-10-CM | POA: Diagnosis not present

## 2022-08-24 DIAGNOSIS — I10 Essential (primary) hypertension: Secondary | ICD-10-CM | POA: Diagnosis not present

## 2022-08-24 DIAGNOSIS — E785 Hyperlipidemia, unspecified: Secondary | ICD-10-CM | POA: Diagnosis not present

## 2022-08-28 DIAGNOSIS — Z23 Encounter for immunization: Secondary | ICD-10-CM | POA: Diagnosis not present

## 2022-10-07 DIAGNOSIS — E119 Type 2 diabetes mellitus without complications: Secondary | ICD-10-CM | POA: Diagnosis not present

## 2022-10-07 DIAGNOSIS — I10 Essential (primary) hypertension: Secondary | ICD-10-CM | POA: Diagnosis not present

## 2022-10-07 DIAGNOSIS — E785 Hyperlipidemia, unspecified: Secondary | ICD-10-CM | POA: Diagnosis not present

## 2023-01-18 DIAGNOSIS — K080 Exfoliation of teeth due to systemic causes: Secondary | ICD-10-CM | POA: Diagnosis not present

## 2023-02-15 DIAGNOSIS — K080 Exfoliation of teeth due to systemic causes: Secondary | ICD-10-CM | POA: Diagnosis not present

## 2023-02-16 DIAGNOSIS — K080 Exfoliation of teeth due to systemic causes: Secondary | ICD-10-CM | POA: Diagnosis not present

## 2023-03-01 ENCOUNTER — Other Ambulatory Visit: Payer: Self-pay | Admitting: Obstetrics and Gynecology

## 2023-03-01 DIAGNOSIS — Z1231 Encounter for screening mammogram for malignant neoplasm of breast: Secondary | ICD-10-CM

## 2023-04-05 DIAGNOSIS — I10 Essential (primary) hypertension: Secondary | ICD-10-CM | POA: Diagnosis not present

## 2023-04-05 DIAGNOSIS — E782 Mixed hyperlipidemia: Secondary | ICD-10-CM | POA: Diagnosis not present

## 2023-04-05 DIAGNOSIS — E119 Type 2 diabetes mellitus without complications: Secondary | ICD-10-CM | POA: Diagnosis not present

## 2023-04-05 DIAGNOSIS — F411 Generalized anxiety disorder: Secondary | ICD-10-CM | POA: Diagnosis not present

## 2023-04-06 DIAGNOSIS — K080 Exfoliation of teeth due to systemic causes: Secondary | ICD-10-CM | POA: Diagnosis not present

## 2023-04-08 ENCOUNTER — Ambulatory Visit
Admission: RE | Admit: 2023-04-08 | Discharge: 2023-04-08 | Disposition: A | Discharge status: Home / Self Care | Payer: Medicare Other | Source: Ambulatory Visit | Attending: Obstetrics and Gynecology | Admitting: Obstetrics and Gynecology

## 2023-04-08 DIAGNOSIS — Z1231 Encounter for screening mammogram for malignant neoplasm of breast: Secondary | ICD-10-CM

## 2023-06-03 DIAGNOSIS — E785 Hyperlipidemia, unspecified: Secondary | ICD-10-CM | POA: Diagnosis not present

## 2023-06-03 DIAGNOSIS — I10 Essential (primary) hypertension: Secondary | ICD-10-CM | POA: Diagnosis not present

## 2023-06-03 DIAGNOSIS — E119 Type 2 diabetes mellitus without complications: Secondary | ICD-10-CM | POA: Diagnosis not present

## 2023-06-04 DIAGNOSIS — K529 Noninfective gastroenteritis and colitis, unspecified: Secondary | ICD-10-CM | POA: Diagnosis not present

## 2023-06-09 DIAGNOSIS — K529 Noninfective gastroenteritis and colitis, unspecified: Secondary | ICD-10-CM | POA: Diagnosis not present

## 2023-06-16 ENCOUNTER — Other Ambulatory Visit: Payer: Self-pay | Admitting: Internal Medicine

## 2023-06-16 DIAGNOSIS — K8681 Exocrine pancreatic insufficiency: Secondary | ICD-10-CM

## 2023-07-02 DIAGNOSIS — H52203 Unspecified astigmatism, bilateral: Secondary | ICD-10-CM | POA: Diagnosis not present

## 2023-07-06 DIAGNOSIS — K080 Exfoliation of teeth due to systemic causes: Secondary | ICD-10-CM | POA: Diagnosis not present

## 2023-07-07 ENCOUNTER — Ambulatory Visit
Admission: RE | Admit: 2023-07-07 | Discharge: 2023-07-07 | Disposition: A | Discharge status: Home / Self Care | Payer: Medicare Other | Source: Ambulatory Visit | Attending: Internal Medicine | Admitting: Internal Medicine

## 2023-07-07 DIAGNOSIS — K8681 Exocrine pancreatic insufficiency: Secondary | ICD-10-CM

## 2023-07-07 MED ORDER — IOPAMIDOL (ISOVUE-300) INJECTION 61%
100.0000 mL | Freq: Once | INTRAVENOUS | Status: AC | PRN
  Administered 2023-07-07: 80 mL via INTRAVENOUS

## 2023-07-12 DIAGNOSIS — E119 Type 2 diabetes mellitus without complications: Secondary | ICD-10-CM | POA: Diagnosis not present

## 2023-07-12 DIAGNOSIS — I10 Essential (primary) hypertension: Secondary | ICD-10-CM | POA: Diagnosis not present

## 2023-07-12 DIAGNOSIS — F411 Generalized anxiety disorder: Secondary | ICD-10-CM | POA: Diagnosis not present

## 2023-07-12 DIAGNOSIS — E782 Mixed hyperlipidemia: Secondary | ICD-10-CM | POA: Diagnosis not present

## 2023-07-13 DIAGNOSIS — R8290 Unspecified abnormal findings in urine: Secondary | ICD-10-CM | POA: Diagnosis not present

## 2023-07-20 DIAGNOSIS — Z9189 Other specified personal risk factors, not elsewhere classified: Secondary | ICD-10-CM | POA: Diagnosis not present

## 2023-07-21 DIAGNOSIS — K8681 Exocrine pancreatic insufficiency: Secondary | ICD-10-CM | POA: Diagnosis not present

## 2023-07-26 DIAGNOSIS — K8681 Exocrine pancreatic insufficiency: Secondary | ICD-10-CM | POA: Diagnosis not present

## 2023-08-03 DIAGNOSIS — R600 Localized edema: Secondary | ICD-10-CM | POA: Diagnosis not present

## 2023-08-16 DIAGNOSIS — H524 Presbyopia: Secondary | ICD-10-CM | POA: Diagnosis not present

## 2023-10-11 DIAGNOSIS — I10 Essential (primary) hypertension: Secondary | ICD-10-CM | POA: Diagnosis not present

## 2023-10-11 DIAGNOSIS — E782 Mixed hyperlipidemia: Secondary | ICD-10-CM | POA: Diagnosis not present

## 2023-10-11 DIAGNOSIS — E119 Type 2 diabetes mellitus without complications: Secondary | ICD-10-CM | POA: Diagnosis not present

## 2023-10-11 DIAGNOSIS — M150 Primary generalized (osteo)arthritis: Secondary | ICD-10-CM | POA: Diagnosis not present

## 2024-01-10 DIAGNOSIS — M150 Primary generalized (osteo)arthritis: Secondary | ICD-10-CM | POA: Diagnosis not present

## 2024-01-10 DIAGNOSIS — E119 Type 2 diabetes mellitus without complications: Secondary | ICD-10-CM | POA: Diagnosis not present

## 2024-01-10 DIAGNOSIS — I10 Essential (primary) hypertension: Secondary | ICD-10-CM | POA: Diagnosis not present

## 2024-01-10 DIAGNOSIS — E782 Mixed hyperlipidemia: Secondary | ICD-10-CM | POA: Diagnosis not present

## 2024-01-17 DIAGNOSIS — K8681 Exocrine pancreatic insufficiency: Secondary | ICD-10-CM | POA: Diagnosis not present

## 2024-01-17 DIAGNOSIS — K529 Noninfective gastroenteritis and colitis, unspecified: Secondary | ICD-10-CM | POA: Diagnosis not present

## 2024-02-22 DIAGNOSIS — K080 Exfoliation of teeth due to systemic causes: Secondary | ICD-10-CM | POA: Diagnosis not present

## 2024-03-02 ENCOUNTER — Other Ambulatory Visit: Payer: Self-pay | Admitting: Obstetrics and Gynecology

## 2024-03-02 DIAGNOSIS — Z1231 Encounter for screening mammogram for malignant neoplasm of breast: Secondary | ICD-10-CM

## 2024-04-10 DIAGNOSIS — M150 Primary generalized (osteo)arthritis: Secondary | ICD-10-CM | POA: Diagnosis not present

## 2024-04-10 DIAGNOSIS — E782 Mixed hyperlipidemia: Secondary | ICD-10-CM | POA: Diagnosis not present

## 2024-04-10 DIAGNOSIS — E119 Type 2 diabetes mellitus without complications: Secondary | ICD-10-CM | POA: Diagnosis not present

## 2024-04-10 DIAGNOSIS — I10 Essential (primary) hypertension: Secondary | ICD-10-CM | POA: Diagnosis not present

## 2024-04-13 ENCOUNTER — Ambulatory Visit
Admission: RE | Admit: 2024-04-13 | Discharge: 2024-04-13 | Disposition: A | Discharge status: Home / Self Care | Source: Ambulatory Visit | Attending: Obstetrics and Gynecology | Admitting: Obstetrics and Gynecology

## 2024-04-13 DIAGNOSIS — I10 Essential (primary) hypertension: Secondary | ICD-10-CM | POA: Diagnosis not present

## 2024-04-13 DIAGNOSIS — Z1231 Encounter for screening mammogram for malignant neoplasm of breast: Secondary | ICD-10-CM | POA: Diagnosis not present

## 2024-04-13 DIAGNOSIS — E119 Type 2 diabetes mellitus without complications: Secondary | ICD-10-CM | POA: Diagnosis not present

## 2024-04-13 DIAGNOSIS — E785 Hyperlipidemia, unspecified: Secondary | ICD-10-CM | POA: Diagnosis not present

## 2024-07-10 DIAGNOSIS — M150 Primary generalized (osteo)arthritis: Secondary | ICD-10-CM | POA: Diagnosis not present

## 2024-07-10 DIAGNOSIS — E119 Type 2 diabetes mellitus without complications: Secondary | ICD-10-CM | POA: Diagnosis not present

## 2024-07-10 DIAGNOSIS — I10 Essential (primary) hypertension: Secondary | ICD-10-CM | POA: Diagnosis not present

## 2024-07-10 DIAGNOSIS — E782 Mixed hyperlipidemia: Secondary | ICD-10-CM | POA: Diagnosis not present

## 2024-07-25 DIAGNOSIS — Z9189 Other specified personal risk factors, not elsewhere classified: Secondary | ICD-10-CM | POA: Diagnosis not present

## 2024-07-25 DIAGNOSIS — L900 Lichen sclerosus et atrophicus: Secondary | ICD-10-CM | POA: Diagnosis not present

## 2024-07-25 DIAGNOSIS — N952 Postmenopausal atrophic vaginitis: Secondary | ICD-10-CM | POA: Diagnosis not present

## 2024-07-25 DIAGNOSIS — Z01419 Encounter for gynecological examination (general) (routine) without abnormal findings: Secondary | ICD-10-CM | POA: Diagnosis not present

## 2024-07-25 DIAGNOSIS — N842 Polyp of vagina: Secondary | ICD-10-CM | POA: Diagnosis not present

## 2024-08-30 DIAGNOSIS — H52202 Unspecified astigmatism, left eye: Secondary | ICD-10-CM | POA: Diagnosis not present

## 2024-10-09 DIAGNOSIS — I10 Essential (primary) hypertension: Secondary | ICD-10-CM | POA: Diagnosis not present

## 2024-10-09 DIAGNOSIS — E782 Mixed hyperlipidemia: Secondary | ICD-10-CM | POA: Diagnosis not present

## 2024-10-09 DIAGNOSIS — M150 Primary generalized (osteo)arthritis: Secondary | ICD-10-CM | POA: Diagnosis not present

## 2024-10-09 DIAGNOSIS — E119 Type 2 diabetes mellitus without complications: Secondary | ICD-10-CM | POA: Diagnosis not present

## 2024-10-16 DIAGNOSIS — K080 Exfoliation of teeth due to systemic causes: Secondary | ICD-10-CM | POA: Diagnosis not present
